# Patient Record
Sex: Female | Born: 1980 | Race: White | Hispanic: No | Marital: Single | Smoking: Never smoker
Health system: Southern US, Community
[De-identification: ages and names within clinical notes are randomized; demographics above are authoritative.]

## PROBLEM LIST (undated history)

## (undated) DIAGNOSIS — R7301 Impaired fasting glucose: Secondary | ICD-10-CM

## (undated) DIAGNOSIS — I4510 Unspecified right bundle-branch block: Secondary | ICD-10-CM

## (undated) DIAGNOSIS — E039 Hypothyroidism, unspecified: Secondary | ICD-10-CM

## (undated) DIAGNOSIS — I10 Essential (primary) hypertension: Secondary | ICD-10-CM

## (undated) DIAGNOSIS — B019 Varicella without complication: Secondary | ICD-10-CM

## (undated) DIAGNOSIS — E781 Pure hyperglyceridemia: Secondary | ICD-10-CM

## (undated) DIAGNOSIS — I499 Cardiac arrhythmia, unspecified: Secondary | ICD-10-CM

## (undated) DIAGNOSIS — R7303 Prediabetes: Secondary | ICD-10-CM

## (undated) DIAGNOSIS — E079 Disorder of thyroid, unspecified: Secondary | ICD-10-CM

## (undated) DIAGNOSIS — N390 Urinary tract infection, site not specified: Secondary | ICD-10-CM

## (undated) DIAGNOSIS — E7800 Pure hypercholesterolemia, unspecified: Secondary | ICD-10-CM

## (undated) DIAGNOSIS — J301 Allergic rhinitis due to pollen: Secondary | ICD-10-CM

## (undated) DIAGNOSIS — M2550 Pain in unspecified joint: Secondary | ICD-10-CM

## (undated) DIAGNOSIS — M1990 Unspecified osteoarthritis, unspecified site: Secondary | ICD-10-CM

## (undated) HISTORY — DX: Essential (primary) hypertension: I10

## (undated) HISTORY — DX: Allergic rhinitis due to pollen: J30.1

## (undated) HISTORY — DX: Pain in unspecified joint: M25.50

## (undated) HISTORY — DX: Hypothyroidism, unspecified: E03.9

## (undated) HISTORY — DX: Pure hyperglyceridemia: E78.1

## (undated) HISTORY — DX: Varicella without complication: B01.9

## (undated) HISTORY — PX: OTHER SURGICAL HISTORY: SHX169

## (undated) HISTORY — DX: Pure hypercholesterolemia, unspecified: E78.00

## (undated) HISTORY — DX: Impaired fasting glucose: R73.01

## (undated) HISTORY — DX: Prediabetes: R73.03

## (undated) HISTORY — DX: Cardiac arrhythmia, unspecified: I49.9

## (undated) HISTORY — DX: Unspecified right bundle-branch block: I45.10

## (undated) HISTORY — DX: Disorder of thyroid, unspecified: E07.9

## (undated) HISTORY — DX: Unspecified osteoarthritis, unspecified site: M19.90

## (undated) HISTORY — DX: Urinary tract infection, site not specified: N39.0

---

## 2015-06-28 DIAGNOSIS — F410 Panic disorder [episodic paroxysmal anxiety] without agoraphobia: Secondary | ICD-10-CM | POA: Diagnosis not present

## 2015-06-28 DIAGNOSIS — I10 Essential (primary) hypertension: Secondary | ICD-10-CM | POA: Diagnosis not present

## 2015-06-28 DIAGNOSIS — E039 Hypothyroidism, unspecified: Secondary | ICD-10-CM | POA: Diagnosis not present

## 2015-06-28 DIAGNOSIS — E669 Obesity, unspecified: Secondary | ICD-10-CM | POA: Diagnosis not present

## 2015-06-28 DIAGNOSIS — E782 Mixed hyperlipidemia: Secondary | ICD-10-CM | POA: Diagnosis not present

## 2015-06-28 DIAGNOSIS — Z79899 Other long term (current) drug therapy: Secondary | ICD-10-CM | POA: Diagnosis not present

## 2015-10-04 DIAGNOSIS — Z309 Encounter for contraceptive management, unspecified: Secondary | ICD-10-CM | POA: Diagnosis not present

## 2015-10-04 DIAGNOSIS — E039 Hypothyroidism, unspecified: Secondary | ICD-10-CM | POA: Diagnosis not present

## 2015-10-04 DIAGNOSIS — E782 Mixed hyperlipidemia: Secondary | ICD-10-CM | POA: Diagnosis not present

## 2015-10-04 DIAGNOSIS — Z79899 Other long term (current) drug therapy: Secondary | ICD-10-CM | POA: Diagnosis not present

## 2015-11-03 DIAGNOSIS — R0789 Other chest pain: Secondary | ICD-10-CM | POA: Diagnosis not present

## 2015-11-04 DIAGNOSIS — R0789 Other chest pain: Secondary | ICD-10-CM | POA: Diagnosis not present

## 2015-11-18 DIAGNOSIS — I10 Essential (primary) hypertension: Secondary | ICD-10-CM | POA: Diagnosis not present

## 2015-11-18 DIAGNOSIS — Z6841 Body Mass Index (BMI) 40.0 and over, adult: Secondary | ICD-10-CM | POA: Diagnosis not present

## 2015-11-18 DIAGNOSIS — J029 Acute pharyngitis, unspecified: Secondary | ICD-10-CM | POA: Diagnosis not present

## 2015-11-18 DIAGNOSIS — F410 Panic disorder [episodic paroxysmal anxiety] without agoraphobia: Secondary | ICD-10-CM | POA: Diagnosis not present

## 2015-12-13 DIAGNOSIS — F410 Panic disorder [episodic paroxysmal anxiety] without agoraphobia: Secondary | ICD-10-CM | POA: Diagnosis not present

## 2015-12-13 DIAGNOSIS — E669 Obesity, unspecified: Secondary | ICD-10-CM | POA: Diagnosis not present

## 2015-12-13 DIAGNOSIS — I10 Essential (primary) hypertension: Secondary | ICD-10-CM | POA: Diagnosis not present

## 2015-12-13 DIAGNOSIS — Z6839 Body mass index (BMI) 39.0-39.9, adult: Secondary | ICD-10-CM | POA: Diagnosis not present

## 2016-01-03 DIAGNOSIS — E782 Mixed hyperlipidemia: Secondary | ICD-10-CM | POA: Diagnosis not present

## 2016-01-03 DIAGNOSIS — J069 Acute upper respiratory infection, unspecified: Secondary | ICD-10-CM | POA: Diagnosis not present

## 2016-01-03 DIAGNOSIS — I10 Essential (primary) hypertension: Secondary | ICD-10-CM | POA: Diagnosis not present

## 2016-01-03 DIAGNOSIS — Z79899 Other long term (current) drug therapy: Secondary | ICD-10-CM | POA: Diagnosis not present

## 2016-01-03 DIAGNOSIS — E039 Hypothyroidism, unspecified: Secondary | ICD-10-CM | POA: Diagnosis not present

## 2016-04-11 DIAGNOSIS — E782 Mixed hyperlipidemia: Secondary | ICD-10-CM | POA: Diagnosis not present

## 2016-04-11 DIAGNOSIS — Z79899 Other long term (current) drug therapy: Secondary | ICD-10-CM | POA: Diagnosis not present

## 2016-04-11 DIAGNOSIS — E039 Hypothyroidism, unspecified: Secondary | ICD-10-CM | POA: Diagnosis not present

## 2016-04-11 DIAGNOSIS — I10 Essential (primary) hypertension: Secondary | ICD-10-CM | POA: Diagnosis not present

## 2016-04-11 DIAGNOSIS — Z1389 Encounter for screening for other disorder: Secondary | ICD-10-CM | POA: Diagnosis not present

## 2016-06-13 DIAGNOSIS — R42 Dizziness and giddiness: Secondary | ICD-10-CM | POA: Diagnosis not present

## 2016-06-13 DIAGNOSIS — H9190 Unspecified hearing loss, unspecified ear: Secondary | ICD-10-CM | POA: Diagnosis not present

## 2016-06-13 DIAGNOSIS — I10 Essential (primary) hypertension: Secondary | ICD-10-CM | POA: Diagnosis not present

## 2016-07-17 DIAGNOSIS — I10 Essential (primary) hypertension: Secondary | ICD-10-CM | POA: Diagnosis not present

## 2016-07-17 DIAGNOSIS — E039 Hypothyroidism, unspecified: Secondary | ICD-10-CM | POA: Diagnosis not present

## 2016-07-17 DIAGNOSIS — Z79899 Other long term (current) drug therapy: Secondary | ICD-10-CM | POA: Diagnosis not present

## 2016-07-17 DIAGNOSIS — F419 Anxiety disorder, unspecified: Secondary | ICD-10-CM | POA: Diagnosis not present

## 2016-07-17 DIAGNOSIS — E782 Mixed hyperlipidemia: Secondary | ICD-10-CM | POA: Diagnosis not present

## 2016-10-16 DIAGNOSIS — E782 Mixed hyperlipidemia: Secondary | ICD-10-CM | POA: Diagnosis not present

## 2016-10-16 DIAGNOSIS — E039 Hypothyroidism, unspecified: Secondary | ICD-10-CM | POA: Diagnosis not present

## 2016-10-16 DIAGNOSIS — Z79899 Other long term (current) drug therapy: Secondary | ICD-10-CM | POA: Diagnosis not present

## 2016-10-16 DIAGNOSIS — I10 Essential (primary) hypertension: Secondary | ICD-10-CM | POA: Diagnosis not present

## 2016-10-16 DIAGNOSIS — F419 Anxiety disorder, unspecified: Secondary | ICD-10-CM | POA: Diagnosis not present

## 2016-11-18 DIAGNOSIS — J189 Pneumonia, unspecified organism: Secondary | ICD-10-CM | POA: Diagnosis not present

## 2016-11-18 DIAGNOSIS — R0602 Shortness of breath: Secondary | ICD-10-CM | POA: Diagnosis not present

## 2016-12-18 DIAGNOSIS — I10 Essential (primary) hypertension: Secondary | ICD-10-CM | POA: Diagnosis not present

## 2016-12-18 DIAGNOSIS — F419 Anxiety disorder, unspecified: Secondary | ICD-10-CM | POA: Diagnosis not present

## 2016-12-18 DIAGNOSIS — R319 Hematuria, unspecified: Secondary | ICD-10-CM | POA: Diagnosis not present

## 2016-12-18 DIAGNOSIS — Z0000 Encounter for general adult medical examination without abnormal findings: Secondary | ICD-10-CM | POA: Diagnosis not present

## 2017-02-21 DIAGNOSIS — J069 Acute upper respiratory infection, unspecified: Secondary | ICD-10-CM | POA: Diagnosis not present

## 2017-02-21 DIAGNOSIS — I10 Essential (primary) hypertension: Secondary | ICD-10-CM | POA: Diagnosis not present

## 2017-02-21 DIAGNOSIS — Z6841 Body Mass Index (BMI) 40.0 and over, adult: Secondary | ICD-10-CM | POA: Diagnosis not present

## 2017-03-26 DIAGNOSIS — E782 Mixed hyperlipidemia: Secondary | ICD-10-CM | POA: Diagnosis not present

## 2017-03-26 DIAGNOSIS — E039 Hypothyroidism, unspecified: Secondary | ICD-10-CM | POA: Diagnosis not present

## 2017-03-26 DIAGNOSIS — I10 Essential (primary) hypertension: Secondary | ICD-10-CM | POA: Diagnosis not present

## 2017-03-26 DIAGNOSIS — F419 Anxiety disorder, unspecified: Secondary | ICD-10-CM | POA: Diagnosis not present

## 2017-03-26 DIAGNOSIS — Z79899 Other long term (current) drug therapy: Secondary | ICD-10-CM | POA: Diagnosis not present

## 2017-07-02 DIAGNOSIS — E039 Hypothyroidism, unspecified: Secondary | ICD-10-CM | POA: Diagnosis not present

## 2017-07-02 DIAGNOSIS — E782 Mixed hyperlipidemia: Secondary | ICD-10-CM | POA: Diagnosis not present

## 2017-07-02 DIAGNOSIS — R946 Abnormal results of thyroid function studies: Secondary | ICD-10-CM | POA: Diagnosis not present

## 2017-07-02 DIAGNOSIS — Z79899 Other long term (current) drug therapy: Secondary | ICD-10-CM | POA: Diagnosis not present

## 2017-10-08 DIAGNOSIS — R946 Abnormal results of thyroid function studies: Secondary | ICD-10-CM | POA: Diagnosis not present

## 2017-10-08 DIAGNOSIS — I10 Essential (primary) hypertension: Secondary | ICD-10-CM | POA: Diagnosis not present

## 2017-10-08 DIAGNOSIS — E039 Hypothyroidism, unspecified: Secondary | ICD-10-CM | POA: Diagnosis not present

## 2017-10-08 DIAGNOSIS — F419 Anxiety disorder, unspecified: Secondary | ICD-10-CM | POA: Diagnosis not present

## 2017-10-08 DIAGNOSIS — Z1331 Encounter for screening for depression: Secondary | ICD-10-CM | POA: Diagnosis not present

## 2017-10-08 DIAGNOSIS — Z79899 Other long term (current) drug therapy: Secondary | ICD-10-CM | POA: Diagnosis not present

## 2017-10-08 DIAGNOSIS — E782 Mixed hyperlipidemia: Secondary | ICD-10-CM | POA: Diagnosis not present

## 2018-04-22 DIAGNOSIS — F419 Anxiety disorder, unspecified: Secondary | ICD-10-CM | POA: Diagnosis not present

## 2018-04-22 DIAGNOSIS — Z79899 Other long term (current) drug therapy: Secondary | ICD-10-CM | POA: Diagnosis not present

## 2018-04-22 DIAGNOSIS — I10 Essential (primary) hypertension: Secondary | ICD-10-CM | POA: Diagnosis not present

## 2018-04-22 DIAGNOSIS — E782 Mixed hyperlipidemia: Secondary | ICD-10-CM | POA: Diagnosis not present

## 2018-04-22 DIAGNOSIS — E039 Hypothyroidism, unspecified: Secondary | ICD-10-CM | POA: Diagnosis not present

## 2018-11-04 DIAGNOSIS — Z79899 Other long term (current) drug therapy: Secondary | ICD-10-CM | POA: Diagnosis not present

## 2018-11-04 DIAGNOSIS — I10 Essential (primary) hypertension: Secondary | ICD-10-CM | POA: Diagnosis not present

## 2018-11-04 DIAGNOSIS — E039 Hypothyroidism, unspecified: Secondary | ICD-10-CM | POA: Diagnosis not present

## 2018-11-04 DIAGNOSIS — R350 Frequency of micturition: Secondary | ICD-10-CM | POA: Diagnosis not present

## 2018-11-04 DIAGNOSIS — F419 Anxiety disorder, unspecified: Secondary | ICD-10-CM | POA: Diagnosis not present

## 2018-11-04 DIAGNOSIS — E782 Mixed hyperlipidemia: Secondary | ICD-10-CM | POA: Diagnosis not present

## 2020-12-01 DIAGNOSIS — R011 Cardiac murmur, unspecified: Secondary | ICD-10-CM | POA: Diagnosis not present

## 2020-12-01 DIAGNOSIS — I517 Cardiomegaly: Secondary | ICD-10-CM | POA: Insufficient documentation

## 2020-12-01 HISTORY — PX: TRANSTHORACIC ECHOCARDIOGRAM: SHX275

## 2021-05-17 ENCOUNTER — Other Ambulatory Visit: Payer: Self-pay

## 2021-05-17 DIAGNOSIS — I10 Essential (primary) hypertension: Secondary | ICD-10-CM | POA: Insufficient documentation

## 2021-05-17 DIAGNOSIS — E785 Hyperlipidemia, unspecified: Secondary | ICD-10-CM | POA: Insufficient documentation

## 2021-05-19 ENCOUNTER — Encounter: Payer: Self-pay | Admitting: Family Medicine

## 2021-05-19 ENCOUNTER — Ambulatory Visit: Payer: BC Managed Care – PPO | Admitting: Family Medicine

## 2021-05-19 VITALS — BP 130/84 | HR 63 | Temp 98.3°F | Ht 65.0 in | Wt 210.0 lb

## 2021-05-19 DIAGNOSIS — Z23 Encounter for immunization: Secondary | ICD-10-CM

## 2021-05-19 DIAGNOSIS — E781 Pure hyperglyceridemia: Secondary | ICD-10-CM | POA: Diagnosis not present

## 2021-05-19 DIAGNOSIS — Z1231 Encounter for screening mammogram for malignant neoplasm of breast: Secondary | ICD-10-CM

## 2021-05-19 DIAGNOSIS — E039 Hypothyroidism, unspecified: Secondary | ICD-10-CM

## 2021-05-19 DIAGNOSIS — I10 Essential (primary) hypertension: Secondary | ICD-10-CM

## 2021-05-19 DIAGNOSIS — R7303 Prediabetes: Secondary | ICD-10-CM

## 2021-05-19 DIAGNOSIS — Z1159 Encounter for screening for other viral diseases: Secondary | ICD-10-CM

## 2021-05-19 DIAGNOSIS — Z0000 Encounter for general adult medical examination without abnormal findings: Secondary | ICD-10-CM

## 2021-05-19 LAB — CBC
HCT: 39.1 % (ref 36.0–46.0)
Hemoglobin: 12.9 g/dL (ref 12.0–15.0)
MCHC: 32.9 g/dL (ref 30.0–36.0)
MCV: 90.9 fl (ref 78.0–100.0)
Platelets: 391 10*3/uL (ref 150.0–400.0)
RBC: 4.3 Mil/uL (ref 3.87–5.11)
RDW: 14.2 % (ref 11.5–15.5)
WBC: 9.5 10*3/uL (ref 4.0–10.5)

## 2021-05-19 LAB — LIPID PANEL
Cholesterol: 164 mg/dL (ref 0–200)
HDL: 50.1 mg/dL (ref >39.00)
LDL Cholesterol: 99 mg/dL (ref 0–99)
NonHDL: 113.6
Total CHOL/HDL Ratio: 3
Triglycerides: 74 mg/dL (ref 0.0–149.0)
VLDL: 14.8 mg/dL (ref 0.0–40.0)

## 2021-05-19 LAB — COMPREHENSIVE METABOLIC PANEL
ALT: 18 U/L (ref 0–35)
AST: 17 U/L (ref 0–37)
Albumin: 4.8 g/dL (ref 3.5–5.2)
Alkaline Phosphatase: 45 U/L (ref 39–117)
BUN: 15 mg/dL (ref 6–23)
CO2: 27 mEq/L (ref 19–32)
Calcium: 9.9 mg/dL (ref 8.4–10.5)
Chloride: 103 mEq/L (ref 96–112)
Creatinine, Ser: 0.8 mg/dL (ref 0.40–1.20)
GFR: 91.13 mL/min (ref >60.00)
Glucose, Bld: 92 mg/dL (ref 70–99)
Potassium: 3.8 mEq/L (ref 3.5–5.1)
Sodium: 139 mEq/L (ref 135–145)
Total Bilirubin: 0.5 mg/dL (ref 0.2–1.2)
Total Protein: 7.4 g/dL (ref 6.0–8.3)

## 2021-05-19 LAB — HEMOGLOBIN A1C: Hgb A1c MFr Bld: 5.4 % (ref 4.6–6.5)

## 2021-05-19 LAB — TSH: TSH: 3.25 u[IU]/mL (ref 0.35–5.50)

## 2021-05-19 NOTE — Progress Notes (Signed)
Office Note ?05/19/2021 ? ?CC:  ?Chief Complaint  ?Patient presents with  ? Establish Care  ? Hypertension  ? ?HPI:  ?Nancy Murphy is a 41 y.o. female who is here to establish care, cpe, f/u chronic medical problems. ?Patient's most recent primary MD: [facility]. ?Old records were reviewed prior to or during today's visit. ? ?Nancy Murphy says she feels well. ? ?Most recent note and old records was from 03/17/2021 and was for follow-up hypertension, hypertriglyceridemia, hypothyroidism, and anxiety. ?Most recent CPE was 04/2020. ?T4 increased to from 88 to 100 qd about 2 mo ago b/c TSH slightly high at 5.2. ? ?She was having some hot flashe in the daytime as well as nighttime for couple of months earlier this year.  She has not had any for the last 1 to 2 months.  She did not have any other symptoms with her hot flashes. ? ?Brief history of some excessive anxiety when she was at a different job.  Use clonazepam very sparingly.  She does not feel like she needs this medication anymore. ?PMP AWARE reviewed today: most recent rx for clonazepam was filled 11/19/20, # 15, rx by prior pcp. ?No red flags. ? ? ?Past Medical History:  ?Diagnosis Date  ? Arthralgia   ? both wrists and left knee intermittently  ? Essential hypertension   ? Hay fever   ? Hypertriglyceridemia   ? Hypothyroidism   ? Incomplete RBBB   ? Prediabetes   ? a1c 5.8% 2022  ? ? ?Past Surgical History:  ?Procedure Laterality Date  ? nexplanon implant    ? 10/2020.  Patient states this was placed to induce amenorrhea  ? TRANSTHORACIC ECHOCARDIOGRAM  12/01/2020  ? EF 55-60%, borderline LVH, diast dysf  ? ? ?Family History  ?Problem Relation Age of Onset  ? Cancer Mother   ? Heart attack Mother   ? Diabetes Mother   ? Arthritis Father   ? Cancer Father   ? High blood pressure Father   ? ? ?Social History  ? ?Socioeconomic History  ? Marital status: Single  ?  Spouse name: Not on file  ? Number of children: Not on file  ? Years of  education: Not on file  ? Highest education level: Not on file  ?Occupational History  ? Not on file  ?Tobacco Use  ? Smoking status: Never  ?  Passive exposure: Never  ? Smokeless tobacco: Never  ?Substance and Sexual Activity  ? Alcohol use: Yes  ? Drug use: Never  ? Sexual activity: Yes  ?  Partners: Male  ?Other Topics Concern  ? Not on file  ?Social History Narrative  ? Single, no children.  ? Orig from [place].  Relocated to [facility] [place].  ? Lives in [place].  ? Educ: some college  ? OccupLawyer.  ? No T/A/Ds  ? ?Social Determinants of Health  ? ?Financial Resource Strain: Not on file  ?Food Insecurity: Not on file  ?Transportation Needs: Not on file  ?Physical Activity: Not on file  ?Stress: Not on file  ?Social Connections: Not on file  ?Intimate Partner Violence: Not on file  ? ? ?Outpatient Encounter Medications as of 05/19/2021  ?Medication Sig  ? ASPIRIN LOW DOSE PO Take 81 mg by mouth daily.  ? diltiazem (CARDIZEM CD) 300 MG 24 hr capsule Take 300 mg by mouth daily.  ? fenofibrate (TRICOR) 145 MG tablet   ? levothyroxine (SYNTHROID) 100 MCG tablet Take 100 mcg by mouth  every morning.  ? Multiple Vitamins-Minerals (ONE DAILY MULTIVITAMIN WOMEN PO) Take by mouth daily.  ? [DISCONTINUED] ELDERBERRY PO Take by mouth daily.  ? ?No facility-administered encounter medications on file as of 05/19/2021.  ? ? ?No Known Allergies ? ?Review of Systems  ?Constitutional:  Negative for chills and fever.  ?HENT:  Negative for congestion, ear pain and sore throat.   ?Eyes:  Negative for discharge and redness.  ?Respiratory:  Negative for cough, shortness of breath and wheezing.   ?Cardiovascular:  Negative for chest pain, palpitations and leg swelling.  ?Gastrointestinal:  Negative for abdominal pain, blood in stool, diarrhea, nausea and vomiting.  ?Genitourinary:  Negative for dysuria, flank pain, frequency, hematuria and urgency.  ?Musculoskeletal:  Negative for back pain and myalgias.  ?Skin:  Negative  for rash.  ?Neurological:  Negative for dizziness, weakness and headaches.  ?Endo/Heme/Allergies:  Does not bruise/bleed easily.  ?Psychiatric/Behavioral:  The patient is not nervous/anxious.   ? ?PE; ?Blood pressure 130/84, pulse 63, temperature 98.3 ?F (36.8 ?C), temperature source Oral, height 5\' 5"  (1.651 m), weight 210 lb (95.3 kg), SpO2 97 %.Body mass index is 34.95 kg/m?. ? ?Exam chaperoned by , CMA. ? ?Physical Exam ? ?Gen: Alert, well appearing.  Patient is oriented to person, place, time, and situation. ?AFFECT: pleasant, lucid thought and speech. ?ENT: Ears: EACs clear, normal epithelium.  TMs with good light reflex and landmarks bilaterally.  Eyes: no injection, icteris, swelling, or exudate.  EOMI, PERRLA. ?Nose: no drainage or turbinate edema/swelling.  No injection or focal lesion.  Mouth: lips without lesion/swelling.  Oral mucosa pink and moist.  Dentition intact and without obvious caries or gingival swelling.  Oropharynx without erythema, exudate, or swelling.  ?Neck: supple/nontender.  No LAD, mass, or TM.  Carotid pulses 2+ bilaterally, without bruits. ?CV: RRR, no m/r/g.   ?LUNGS: CTA bilat, nonlabored resps, good aeration in all lung fields. ?ABD: soft, NT, ND, BS normal.  No hepatospenomegaly or mass.  No bruits. ?EXT: no clubbing, cyanosis, or edema.  ?Musculoskeletal: no joint swelling, erythema, warmth, or tenderness.  ROM of all joints intact. ?Skin - no sores or suspicious lesions or rashes or color changes ? ?Pertinent labs:  ?None today  ? ?ASSESSMENT AND PLAN:  ? ?New patient, establishing care. ? ?#1 hypertension.  Well-controlled on diltiazem CD3 100 mg a day. ?Checking electrolytes and creatinine today. ? ?2.  Hypertriglyceridemia.  Stable on fenofibrate 145 mg a day. ?Lipid panel and hepatic panel today. ? ?3.  Hypothyroidism.  Mildly elevated TSH on monitoring 2 months ago.  Her dose was increased from 88 to 100 mcg a day at that time. ?Check TSH today. ? ?4.  Health maintenance exam: ?Reviewed age and gender appropriate health maintenance issues (prudent diet, regular exercise, health risks of tobacco and excessive alcohol, use of seatbelts, fire alarms in home, use of sunscreen).  Also reviewed age and gender appropriate health screening as well as vaccine recommendations. ?Vaccines: Tdap given today. ?Labs: fasting HP, Hba1c, hep c screening. ?Cervical ca screening: PAP/HPV neg 05/06/20.  No hx abnormals. Plan rpt 5 yrs. ?Breast ca screening: due for annual mammogram now (05/2020 mammo neg)--ordered. ?Colon ca screening: average risk patient= as per latest guidelines, start screening at 53 yrs of age. ? ?An After Visit Summary was printed and given to the patient. ? ?Return in about 6 months (around 11/19/2021) for routine chronic illness f/u. ? ?Signed:  13/04/2021, MD  05/19/2021 ? ?

## 2021-05-19 NOTE — Addendum Note (Signed)
Addended by: Emi Holes D on: 05/19/2021 10:24 AM ? ? Modules accepted: Orders ? ?

## 2021-05-19 NOTE — Patient Instructions (Signed)

## 2021-05-20 LAB — HEPATITIS C ANTIBODY
Hepatitis C Ab: NONREACTIVE
SIGNAL TO CUT-OFF: 0.09 (ref <1.00)

## 2021-05-21 ENCOUNTER — Encounter: Payer: Self-pay | Admitting: Family Medicine

## 2021-05-23 ENCOUNTER — Other Ambulatory Visit: Payer: Self-pay

## 2021-05-23 ENCOUNTER — Telehealth: Payer: Self-pay | Admitting: Family Medicine

## 2021-05-23 MED ORDER — FENOFIBRATE 145 MG PO TABS: 145.0000 mg | ORAL_TABLET | Freq: Every day | ORAL | 1 refills | Status: DC

## 2021-05-23 NOTE — Telephone Encounter (Signed)
Please advise 

## 2021-05-23 NOTE — Telephone Encounter (Signed)
Please sign incomplete notes  ?

## 2021-05-23 NOTE — Progress Notes (Unsigned)
She is no longer prediabetic since her Hba1c is < 5.7%. ?Also, triglycerides MUCH improved.  Now they are down to 74.   ?OK to stop fenofibrate now.  We'll recheck these labs at next f/u in 6 months. ?Keep up the great work! ?

## 2021-05-23 NOTE — Progress Notes (Unsigned)
Nancy Murphy, I want to reassure you that your complete blood count is completely normal.  Do not worry! ? ?

## 2021-05-23 NOTE — Telephone Encounter (Signed)
Pt returned call for lab results.  ?Best call back # [phone] ? ?Also said that pharmacy has not received RX for fenofibrate. ?

## 2021-05-23 NOTE — Telephone Encounter (Signed)
LM for pt to return call to discuss.  

## 2021-05-24 NOTE — Telephone Encounter (Signed)
No further action needed.

## 2021-06-21 ENCOUNTER — Ambulatory Visit
Admission: RE | Admit: 2021-06-21 | Discharge: 2021-06-21 | Disposition: A | Discharge status: Home / Self Care | Payer: BC Managed Care – PPO | Source: Ambulatory Visit | Attending: Family Medicine | Admitting: Family Medicine

## 2021-06-21 DIAGNOSIS — Z1231 Encounter for screening mammogram for malignant neoplasm of breast: Secondary | ICD-10-CM

## 2021-06-22 ENCOUNTER — Other Ambulatory Visit: Payer: Self-pay | Admitting: Family Medicine

## 2021-06-22 MED ORDER — LEVOTHYROXINE SODIUM 100 MCG PO TABS
100.0000 ug | ORAL_TABLET | Freq: Every morning | ORAL | 1 refills | Status: DC
Start: 2021-06-22 — End: 2021-11-22

## 2021-06-22 MED ORDER — DILTIAZEM HCL ER COATED BEADS 300 MG PO CP24
300.0000 mg | ORAL_CAPSULE | Freq: Every day | ORAL | 1 refills | Status: DC
Start: 2021-06-22 — End: 2022-01-20

## 2021-06-22 NOTE — Telephone Encounter (Signed)
Pt has not had meds previously filled by PCP. Last seen 06/08/21 for establish care appt.  Meds requested diltiazem and levothyroxine. Please fill, if appropriate.

## 2021-06-22 NOTE — Telephone Encounter (Signed)
Caller Name: Vanda Worsham  Call back phone [phone]  MEDICATION(S): diltiazem (CARDIZEM CD) 300 MG 24 hr capsule [id number] and levothyroxine (SYNTHROID) 100 MCG tablet [id number]    Days of Med Remaining:   Has the patient contacted their pharmacy (YES/NO)?  Yes she thinks they sent the request to her previous prov.  I  Preferred Pharmacy: [facility] Address: [address] [place], [place] [address]  ~~~Please advise patient/caregiver to allow 2-3 business days to process RX refills.

## 2021-06-23 ENCOUNTER — Other Ambulatory Visit: Payer: Self-pay | Admitting: Family Medicine

## 2021-06-23 DIAGNOSIS — R928 Other abnormal and inconclusive findings on diagnostic imaging of breast: Secondary | ICD-10-CM

## 2021-07-04 ENCOUNTER — Ambulatory Visit: Payer: BC Managed Care – PPO | Admitting: Family Medicine

## 2021-07-04 ENCOUNTER — Telehealth: Payer: Self-pay

## 2021-07-04 NOTE — Progress Notes (Deleted)
OFFICE VISIT  07/04/2021  CC: No chief complaint on file.   Patient is a 41 y.o. female who presents for ear concern.  HPI: ***    Past Medical History:  Diagnosis Date   Arthralgia    both wrists and left knee intermittently   Essential hypertension    Hay fever    Hypertriglyceridemia    Hypothyroidism    Incomplete RBBB    Prediabetes    a1c 5.8% 2022    Past Surgical History:  Procedure Laterality Date   nexplanon implant     10/2020.  Patient states this was placed to induce amenorrhea   TRANSTHORACIC ECHOCARDIOGRAM  12/01/2020   EF 55-60%, borderline LVH, diast dysf    Outpatient Medications Prior to Visit  Medication Sig Dispense Refill   ASPIRIN LOW DOSE PO Take 81 mg by mouth daily.     diltiazem (CARDIZEM CD) 300 MG 24 hr capsule Take 1 capsule (300 mg total) by mouth daily. 90 capsule 1   levothyroxine (SYNTHROID) 100 MCG tablet Take 1 tablet (100 mcg total) by mouth every morning. 90 tablet 1   Multiple Vitamins-Minerals (ONE DAILY MULTIVITAMIN WOMEN PO) Take by mouth daily.     No facility-administered medications prior to visit.    No Known Allergies  ROS As per HPI  PE:    05/19/2021    9:37 AM  Vitals with BMI  Height 5\' 5"   Weight 210 lbs  BMI 34.95  Systolic 130  Diastolic 84  Pulse 63     Physical Exam  ***  LABS:  Last CBC Lab Results  Component Value Date   WBC 9.5 05/19/2021   HGB 12.9 05/19/2021   HCT 39.1 05/19/2021   MCV 90.9 05/19/2021   RDW 14.2 05/19/2021   PLT 391.0 05/19/2021   Last metabolic panel Lab Results  Component Value Date   GLUCOSE 92 05/19/2021   NA 139 05/19/2021   K 3.8 05/19/2021   CL 103 05/19/2021   CO2 27 05/19/2021   BUN 15 05/19/2021   CREATININE 0.80 05/19/2021   CALCIUM 9.9 05/19/2021   PROT 7.4 05/19/2021   ALBUMIN 4.8 05/19/2021   BILITOT 0.5 05/19/2021   ALKPHOS 45 05/19/2021   AST 17 05/19/2021   ALT 18 05/19/2021   IMPRESSION AND PLAN:  No problem-specific Assessment  & Plan notes found for this encounter.   An After Visit Summary was printed and given to the patient.  FOLLOW UP: No follow-ups on file.  Signed:  07/19/2021, MD           07/04/2021

## 2021-07-04 NOTE — Telephone Encounter (Signed)
Pt cancelled via after hours line.  [facility] Day - Client Nonclinical Telephone Record  [facility] Client [facility] Day - Client Client Site [facility] - Day Provider Santiago Bumpers - MD Contact Type Call Who Is Calling Patient / Member / Family / Caregiver Caller Name Nancy Murphy Caller Phone Number [phone] Patient Name Nancy Murphy Patient DOB 12/09/1980 Call Type Message Only Information Provided Reason for Call Request to [facility] Appointment Initial Comment Caller states that she would like to cancel her appointment for Monday 07/04/2021. Disp. Time Disposition Final User 07/02/2021 6:53:54 PM General Information Provided Yes Cruz Condon, Amy Call Closed By: Val Riles Transaction Date/Time: 07/02/2021 6:51:33 PM (ET)

## 2021-07-12 ENCOUNTER — Other Ambulatory Visit: Payer: BC Managed Care – PPO

## 2021-07-29 ENCOUNTER — Ambulatory Visit: Payer: BC Managed Care – PPO

## 2021-07-29 ENCOUNTER — Ambulatory Visit
Admission: RE | Admit: 2021-07-29 | Discharge: 2021-07-29 | Disposition: A | Discharge status: Home / Self Care | Payer: BC Managed Care – PPO | Source: Ambulatory Visit | Attending: Family Medicine | Admitting: Family Medicine

## 2021-07-29 DIAGNOSIS — R928 Other abnormal and inconclusive findings on diagnostic imaging of breast: Secondary | ICD-10-CM

## 2021-09-26 ENCOUNTER — Ambulatory Visit: Payer: BC Managed Care – PPO | Admitting: Family Medicine

## 2021-09-26 ENCOUNTER — Encounter: Payer: Self-pay | Admitting: Family Medicine

## 2021-09-26 VITALS — BP 133/79 | HR 70 | Temp 98.5°F | Ht 65.0 in | Wt 224.6 lb

## 2021-09-26 DIAGNOSIS — R300 Dysuria: Secondary | ICD-10-CM | POA: Diagnosis not present

## 2021-09-26 DIAGNOSIS — R3129 Other microscopic hematuria: Secondary | ICD-10-CM

## 2021-09-26 DIAGNOSIS — L304 Erythema intertrigo: Secondary | ICD-10-CM | POA: Diagnosis not present

## 2021-09-26 LAB — POCT URINALYSIS DIPSTICK
Bilirubin, UA: NEGATIVE
Glucose, UA: NEGATIVE
Ketones, UA: NEGATIVE
Leukocytes, UA: NEGATIVE
Nitrite, UA: NEGATIVE
Protein, UA: POSITIVE — AB
Spec Grav, UA: 1.01 (ref 1.010–1.025)
Urobilinogen, UA: 0.2 E.U./dL
pH, UA: 6 (ref 5.0–8.0)

## 2021-09-26 LAB — URINALYSIS, ROUTINE W REFLEX MICROSCOPIC
Bilirubin Urine: NEGATIVE
Ketones, ur: NEGATIVE
Nitrite: NEGATIVE
Specific Gravity, Urine: 1.01 (ref 1.000–1.030)
Total Protein, Urine: NEGATIVE
Urine Glucose: NEGATIVE
Urobilinogen, UA: 0.2 (ref 0.0–1.0)
pH: 7 (ref 5.0–8.0)

## 2021-09-26 MED ORDER — KETOCONAZOLE 2 % EX CREA
1.0000 | TOPICAL_CREAM | Freq: Every day | CUTANEOUS | 1 refills | Status: DC
Start: 2021-09-26 — End: 2021-11-02

## 2021-09-26 NOTE — Patient Instructions (Signed)
You can use A&D ointment preventatively.

## 2021-09-26 NOTE — Progress Notes (Signed)
OFFICE VISIT  09/26/2021  CC:  Chief Complaint  Patient presents with   Infection of belly button    Pain, redness, odor, and leakage at times. Pt has noticed this within the past week. Was using neosporin and peroxide.    Patient is a 41 y.o. female who presents for "infection around belly button".  HPI: Nancy Murphy has had a red and irritated umbilical region for the last few months. Occasionally it has a fissure in it.  Occasionally some white substanc is found in it and it has bad odor. Has tried Neosporin some.  Used a little bit of peroxide some.  Additionally, she had urinary urgency, frequency, suprapubic pressure, and dysuria about 10 days ago.  She then saw a provider in urgent care and was prescribed Bactrim for 7 days.  She completed this.  Yesterday she was noting a bit of burning with urination but said she had just recently use a cleansing wipe that she had never used before. She has no urinary urgency or frequency or suprapubic pressure.  Past Medical History:  Diagnosis Date   Arthralgia    both wrists and left knee intermittently   Essential hypertension    Hay fever    Hypertriglyceridemia    Hypothyroidism    Incomplete RBBB    Prediabetes    a1c 5.8% 2022    Past Surgical History:  Procedure Laterality Date   nexplanon implant     10/2020.  Patient states this was placed to induce amenorrhea   TRANSTHORACIC ECHOCARDIOGRAM  12/01/2020   EF 55-60%, borderline LVH, diast dysf    Outpatient Medications Prior to Visit  Medication Sig Dispense Refill   ASPIRIN LOW DOSE PO Take 81 mg by mouth daily.     diltiazem (CARDIZEM CD) 300 MG 24 hr capsule Take 1 capsule (300 mg total) by mouth daily. 90 capsule 1   levothyroxine (SYNTHROID) 100 MCG tablet Take 1 tablet (100 mcg total) by mouth every morning. 90 tablet 1   Multiple Vitamins-Minerals (ONE DAILY MULTIVITAMIN WOMEN PO) Take by mouth daily.     No facility-administered medications prior to visit.    No  Known Allergies  ROS As per HPI  PE:    09/26/2021    2:00 PM 05/19/2021    9:37 AM  Vitals with BMI  Height 5\' 5"  5\' 5"   Weight 224 lbs 10 oz 210 lbs  BMI 37.38 34.95  Systolic 133 130  Diastolic 79 84  Pulse 70 63     Physical Exam  Gen: Alert, well appearing.  Patient is oriented to person, place, time, and situation. AFFECT: pleasant, lucid thought and speech. Umbilicus with erythematous macular rash with a bit of maceration.  No exudate.  LABS:  Last CBC Lab Results  Component Value Date   WBC 9.5 05/19/2021   HGB 12.9 05/19/2021   HCT 39.1 05/19/2021   MCV 90.9 05/19/2021   RDW 14.2 05/19/2021   PLT 391.0 05/19/2021   Last metabolic panel Lab Results  Component Value Date   GLUCOSE 92 05/19/2021   NA 139 05/19/2021   K 3.8 05/19/2021   CL 103 05/19/2021   CO2 27 05/19/2021   BUN 15 05/19/2021   CREATININE 0.80 05/19/2021   CALCIUM 9.9 05/19/2021   PROT 7.4 05/19/2021   ALBUMIN 4.8 05/19/2021   BILITOT 0.5 05/19/2021   ALKPHOS 45 05/19/2021   AST 17 05/19/2021   ALT 18 05/19/2021   Last hemoglobin A1c Lab Results  Component Value Date  HGBA1C 5.4 05/19/2021   IMPRESSION AND PLAN:  #1 intertrigo. Ketoconazole cream 2%, apply daily. A&E ointment recommended. Keep area as dry and cool as possible.  #2 dysuria.  I think this is due to irritation from a cleansing wipe. Her recent urinary tract infection symptoms have all resolved status post a 7-day course of Bactrim.  Point-of-care dipstick UA today with trace blood. Will send for micro and culture.  An After Visit Summary was printed and given to the patient.  FOLLOW UP: No follow-ups on file.  Signed:  Santiago Bumpers, MD           09/26/2021

## 2021-09-27 LAB — URINE CULTURE
MICRO NUMBER:: 13899791
Result:: NO GROWTH
SPECIMEN QUALITY:: ADEQUATE

## 2021-09-28 ENCOUNTER — Telehealth: Payer: Self-pay

## 2021-09-28 MED ORDER — FLUCONAZOLE 150 MG PO TABS: 150.0000 mg | ORAL_TABLET | Freq: Once | ORAL | 0 refills | Status: AC

## 2021-09-28 NOTE — Telephone Encounter (Signed)
-----   Message from Jeoffrey Massed, MD sent at 09/28/2021 12:02 PM EDT ----- Pls get the records from the urgent care that she saw recently---I particularly need their UA and urine culture results. In the meantime, I'm going to treat for the possibility of yeast infection as the cause of her symptoms. Pls send in diflucan 150mg , 1 tab po qd x 3, #3, no RF. Pt to call or send mychart message in 2d with report of how she's doing.

## 2021-11-02 ENCOUNTER — Encounter: Payer: Self-pay | Admitting: Family Medicine

## 2021-11-02 ENCOUNTER — Telehealth (INDEPENDENT_AMBULATORY_CARE_PROVIDER_SITE_OTHER): Payer: BC Managed Care – PPO | Admitting: Family Medicine

## 2021-11-02 VITALS — BP 152/95 | HR 65

## 2021-11-02 DIAGNOSIS — I10 Essential (primary) hypertension: Secondary | ICD-10-CM | POA: Diagnosis not present

## 2021-11-02 MED ORDER — VALSARTAN 80 MG PO TABS: 80.0000 mg | ORAL_TABLET | Freq: Every day | ORAL | 0 refills | Status: DC

## 2021-11-02 NOTE — Progress Notes (Signed)
Virtual Visit via Video Note  I connected with Nancy Murphy  on 11/02/21 at  2:40 PM EDT by a video enabled telemedicine application and verified that I am speaking with the correct person using two identifiers.  Location patient: [place] Location provider:work or home office Persons participating in the virtual visit: patient, provider  I discussed the limitations and requested verbal permission for telemedicine visit. The patient expressed understanding and agreed to proceed.   HPI: 41 y/o female being seen today for elevated blood pressure. She was feeling some headache and episodes of nausea 4-5 days ago so she started checking her blood pressure 2 days ago.  Measurements have been [phone] systolic over 91-478 diastolic. Heart rate typically in the 60s. States her blood pressure had been controlled in the past on Cardizem CD3 100 mg a day.  States she no longer has any headache or nausea.  ROS: No chest pain, no diaphoresis, no shortness of breath, no dizziness, no palpitations, no edema, no vision abnormalities.  Past Medical History:  Diagnosis Date   Arthralgia    both wrists and left knee intermittently   Essential hypertension    Hay fever    Hypertriglyceridemia    Hypothyroidism    Incomplete RBBB    Prediabetes    a1c 5.8% 2022    Past Surgical History:  Procedure Laterality Date   nexplanon implant     10/2020.  Patient states this was placed to induce amenorrhea   TRANSTHORACIC ECHOCARDIOGRAM  12/01/2020   EF 55-60%, borderline LVH, diast dysf     Current Outpatient Medications:    ASPIRIN LOW DOSE PO, Take 81 mg by mouth daily., Disp: , Rfl:    diltiazem (CARDIZEM CD) 300 MG 24 hr capsule, Take 1 capsule (300 mg total) by mouth daily., Disp: 90 capsule, Rfl: 1   levothyroxine (SYNTHROID) 100 MCG tablet, Take 1 tablet (100 mcg total) by mouth every morning., Disp: 90 tablet, Rfl: 1   Multiple Vitamins-Minerals (ONE DAILY MULTIVITAMIN WOMEN PO), Take by mouth daily.,  Disp: , Rfl:   EXAM:  VITALS per patient if applicable:     29/56/2130    2:31 PM 09/26/2021    2:00 PM 05/19/2021    9:37 AM  Vitals with BMI  Height  5\' 5"  5\' 5"   Weight  224 lbs 10 oz 210 lbs  BMI  86.57 84.69  Systolic [phone]  Diastolic 95 79 84  Pulse 65 70 63    GENERAL: alert, oriented, appears well and in no acute distress  HEENT: atraumatic, conjunttiva clear, no obvious abnormalities on inspection of external nose and ears  NECK: normal movements of the head and neck  LUNGS: on inspection no signs of respiratory distress, breathing rate appears normal, no obvious gross SOB, gasping or wheezing  CV: no obvious cyanosis  MS: moves all visible extremities without noticeable abnormality  PSYCH/NEURO: pleasant and cooperative, no obvious depression or anxiety, speech and thought processing grossly intact  LABS: none today    Chemistry      Component Value Date/Time   NA 139 05/19/2021 1016   K 3.8 05/19/2021 1016   CL 103 05/19/2021 1016   CO2 27 05/19/2021 1016   BUN 15 05/19/2021 1016   CREATININE 0.80 05/19/2021 1016      Component Value Date/Time   CALCIUM 9.9 05/19/2021 1016   ALKPHOS 45 05/19/2021 1016   AST 17 05/19/2021 1016   ALT 18 05/19/2021 1016   BILITOT 0.5 05/19/2021 1016  ASSESSMENT AND PLAN:  Discussed the following assessment and plan:  Uncontrolled hypertension. Start valsartan 80 mg a day.  Continue diltiazem CD3 100 mg a day. Continue to monitor blood pressure.  Reviewed goal blood pressure 130/80 or better. Continue Percocet eating low-sodium diet and exercising regularly. Recheck in the office 1 week to go over blood pressures and monitor electrolytes and kidney function.    I discussed the assessment and treatment plan with the patient. The patient was provided an opportunity to ask questions and all were answered. The patient agreed with the plan and demonstrated an understanding of the instructions.   F/u: 1  wk  Signed:  Santiago Bumpers, MD           11/02/2021

## 2021-11-03 ENCOUNTER — Other Ambulatory Visit: Payer: Self-pay

## 2021-11-15 ENCOUNTER — Encounter: Payer: Self-pay | Admitting: Family Medicine

## 2021-11-15 ENCOUNTER — Ambulatory Visit: Payer: BC Managed Care – PPO | Admitting: Family Medicine

## 2021-11-15 VITALS — BP 136/84 | HR 97 | Temp 98.7°F | Ht 65.0 in | Wt 220.8 lb

## 2021-11-15 DIAGNOSIS — I10 Essential (primary) hypertension: Secondary | ICD-10-CM

## 2021-11-15 LAB — BASIC METABOLIC PANEL
BUN: 14 mg/dL (ref 6–23)
CO2: 29 mEq/L (ref 19–32)
Calcium: 9.4 mg/dL (ref 8.4–10.5)
Chloride: 102 mEq/L (ref 96–112)
Creatinine, Ser: 0.67 mg/dL (ref 0.40–1.20)
GFR: 107.73 mL/min (ref >60.00)
Glucose, Bld: 101 mg/dL — ABNORMAL HIGH (ref 70–99)
Potassium: 3.8 mEq/L (ref 3.5–5.1)
Sodium: 140 mEq/L (ref 135–145)

## 2021-11-15 MED ORDER — VALSARTAN 80 MG PO TABS: 80.0000 mg | ORAL_TABLET | Freq: Every day | ORAL | 1 refills | Status: DC

## 2021-11-15 MED ORDER — AMOXICILLIN 875 MG PO TABS: 875.0000 mg | ORAL_TABLET | Freq: Two times a day (BID) | ORAL | 0 refills | Status: AC

## 2021-11-15 NOTE — Progress Notes (Signed)
OFFICE VISIT  11/15/2021  CC:  Chief Complaint  Patient presents with   URI    Congestion., sore throat, runny nose, cough x4 days. Alkaselzer cold plus, Zicam with little relief     Patient is a 41 y.o. female who presents for cold symptoms as well as 2-week follow-up uncontrolled hypertension. A/P as of last visit: "Uncontrolled hypertension. Start valsartan 80 mg a day.  Continue diltiazem CD3 100 mg a day. Continue to monitor blood pressure.  Reviewed goal blood pressure 130/80 or better. Continue efforts at eating low-sodium diet and exercising regularly. Recheck in the office 1 week to go over blood pressures and monitor electrolytes and kidney function"  HPI: 4d nasal cong/runny nose, PND, mild ST (better now), some cough, some fatigue and mild HA. No body aches, no SOB, no fevers, no wheezing. Alka seltzer cold, zycam, no help.  Home bp's avg 287 systolic since getting on valsartan 2 wks ago.   Past Medical History:  Diagnosis Date   Arthralgia    both wrists and left knee intermittently   Essential hypertension    Hay fever    Hypertriglyceridemia    Hypothyroidism    Incomplete RBBB    Prediabetes    a1c 5.8% 2022    Past Surgical History:  Procedure Laterality Date   nexplanon implant     10/2020.  Patient states this was placed to induce amenorrhea   TRANSTHORACIC ECHOCARDIOGRAM  12/01/2020   EF 55-60%, borderline LVH, diast dysf    Outpatient Medications Prior to Visit  Medication Sig Dispense Refill   ASPIRIN LOW DOSE PO Take 81 mg by mouth daily.     diltiazem (CARDIZEM CD) 300 MG 24 hr capsule Take 1 capsule (300 mg total) by mouth daily. 90 capsule 1   levothyroxine (SYNTHROID) 100 MCG tablet Take 1 tablet (100 mcg total) by mouth every morning. 90 tablet 1   Multiple Vitamins-Minerals (ONE DAILY MULTIVITAMIN WOMEN PO) Take by mouth daily.     valsartan (DIOVAN) 80 MG tablet Take 1 tablet (80 mg total) by mouth daily. 30 tablet 0   No  facility-administered medications prior to visit.    No Known Allergies  ROS As per HPI  PE:    11/15/2021   10:23 AM 11/02/2021    2:31 PM 09/26/2021    2:00 PM  Vitals with BMI  Height 5\' 5"   5\' 5"   Weight 220 lbs 13 oz  224 lbs 10 oz  BMI 86.76  72.09  Systolic [phone]  Diastolic 84 95 79  Pulse 97 65 70     Physical Exam  VS: noted--normal. Gen: alert, NAD, NONTOXIC APPEARING. HEENT: eyes without injection, drainage, or swelling.  Ears: EACs clear, TMs with normal light reflex and landmarks.  Nose: Clear rhinorrhea, with some dried, crusty exudate adherent to mildly injected mucosa.  No purulent d/c.  No paranasal sinus TTP.  No facial swelling.  Throat and mouth without focal lesion.  No pharyngial swelling, erythema, or exudate.   Neck: supple, no LAD.   LUNGS: CTA bilat, nonlabored resps.   CV: RRR, no m/r/g. EXT: no c/c/e SKIN: no rash   LABS:  Last CBC Lab Results  Component Value Date   WBC 9.5 05/19/2021   HGB 12.9 05/19/2021   HCT 39.1 05/19/2021   MCV 90.9 05/19/2021   RDW 14.2 05/19/2021   PLT 391.0 62/94/7654   Last metabolic panel Lab Results  Component Value Date   GLUCOSE 92 05/19/2021  NA 139 05/19/2021   K 3.8 05/19/2021   CL 103 05/19/2021   CO2 27 05/19/2021   BUN 15 05/19/2021   CREATININE 0.80 05/19/2021   CALCIUM 9.9 05/19/2021   PROT 7.4 05/19/2021   ALBUMIN 4.8 05/19/2021   BILITOT 0.5 05/19/2021   ALKPHOS 45 05/19/2021   AST 17 05/19/2021   ALT 18 05/19/2021   Lab Results  Component Value Date   TSH 3.25 05/19/2021   IMPRESSION AND PLAN:  1) Viral URI. Discussed otc symptom mgmt. Rapid flu, covid, and strep all neg here today. Amoxicillin 875 twice daily x7 days--> scription sent and patient is to start this only if not improving in 2 days. Signs/symptoms to call or return for were reviewed and pt expressed understanding.  2) HTN, now well controlled since recent addition of valsartan 80 mg qd. BMET  today.  An After Visit Summary was printed and given to the patient.  FOLLOW UP: Return in about 6 months (around 05/16/2022) for annual CPE (fasting).  Signed:  Crissie Sickles, MD           11/15/2021

## 2021-11-22 ENCOUNTER — Ambulatory Visit: Payer: BC Managed Care – PPO | Admitting: Family Medicine

## 2021-11-22 ENCOUNTER — Other Ambulatory Visit: Payer: Self-pay | Admitting: Family Medicine

## 2021-11-22 ENCOUNTER — Telehealth: Payer: Self-pay | Admitting: Family Medicine

## 2021-11-22 MED ORDER — BENZONATATE 100 MG PO CAPS: 100.0000 mg | ORAL_CAPSULE | Freq: Two times a day (BID) | ORAL | 0 refills | Status: DC | PRN

## 2021-11-22 MED ORDER — BENZONATATE 200 MG PO CAPS: 200.0000 mg | ORAL_CAPSULE | Freq: Two times a day (BID) | ORAL | 0 refills | Status: DC | PRN

## 2021-11-22 NOTE — Telephone Encounter (Signed)
Pt was recently here for a cold on 11/15/21, however she states her cough has worsened and would like to know if another medication can be called in to help with the worsening cough. Please advise pt.

## 2021-11-22 NOTE — Telephone Encounter (Signed)
Pt is also stating that she does not have any refills on her Levothyroxine and is almost out of the medication. I did not notice an office was necessary at this time.

## 2021-11-22 NOTE — Telephone Encounter (Signed)
Please advise if alternative can be provided.

## 2021-11-22 NOTE — Telephone Encounter (Signed)
Please send a prescription for Tessalon Perles 200 mg, 1 twice daily as needed, #20, no refill.

## 2021-11-22 NOTE — Telephone Encounter (Signed)
Confirmed with the pharmacy, pt does not have any refills. 6 month supply sent to pharmacy. Pt advised medication sent.

## 2021-11-22 NOTE — Telephone Encounter (Signed)
Pt advised medication sent. 

## 2022-01-20 ENCOUNTER — Other Ambulatory Visit: Payer: Self-pay

## 2022-01-20 MED ORDER — DILTIAZEM HCL ER COATED BEADS 300 MG PO CP24: 300.0000 mg | ORAL_CAPSULE | Freq: Every day | ORAL | 1 refills | Status: DC

## 2022-01-27 ENCOUNTER — Ambulatory Visit (INDEPENDENT_AMBULATORY_CARE_PROVIDER_SITE_OTHER): Payer: BC Managed Care – PPO | Admitting: Family Medicine

## 2022-01-27 ENCOUNTER — Encounter: Payer: Self-pay | Admitting: Family Medicine

## 2022-01-27 VITALS — BP 137/82 | HR 85 | Temp 98.5°F | Ht 65.0 in | Wt 227.4 lb

## 2022-01-27 DIAGNOSIS — B3731 Acute candidiasis of vulva and vagina: Secondary | ICD-10-CM | POA: Diagnosis not present

## 2022-01-27 DIAGNOSIS — R051 Acute cough: Secondary | ICD-10-CM | POA: Diagnosis not present

## 2022-01-27 DIAGNOSIS — J069 Acute upper respiratory infection, unspecified: Secondary | ICD-10-CM

## 2022-01-27 LAB — POC COVID19 BINAXNOW: SARS Coronavirus 2 Ag: NEGATIVE

## 2022-01-27 LAB — POCT RAPID STREP A (OFFICE): Rapid Strep A Screen: NEGATIVE

## 2022-01-27 MED ORDER — DOXYCYCLINE HYCLATE 100 MG PO CAPS: 100.0000 mg | ORAL_CAPSULE | Freq: Two times a day (BID) | ORAL | 0 refills | Status: AC

## 2022-01-27 MED ORDER — FLUCONAZOLE 150 MG PO TABS: ORAL_TABLET | ORAL | 0 refills | Status: DC

## 2022-01-27 MED ORDER — HYDROCODONE BIT-HOMATROP MBR 5-1.5 MG/5ML PO SOLN: ORAL | 0 refills | Status: DC

## 2022-01-27 NOTE — Progress Notes (Signed)
OFFICE VISIT  01/27/2022  CC:  Chief Complaint  Patient presents with   Cough   Sore Throat    Patient is a 42 y.o. female who presents for nasal congestion and sore throat.  HPI: Onset 8 days ago: Nasal congestion, postnasal drip, sore throat, cough.  Subjective fever x 1 day but none since.  No wheezing, shortness of breath, or chest pain.  No face pain or upper teeth pain. Cough is worse at night, keeps her up. Symptoms have not improved at all. Trying NyQuil and DayQuil. 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 Home COVID test was -4 days ago.   Past Medical History:  Diagnosis Date   Arthralgia    both wrists and left knee intermittently   Essential hypertension    Hay fever    Hypertriglyceridemia    Hypothyroidism    Incomplete RBBB    Prediabetes    a1c 5.8% 2022    Past Surgical History:  Procedure Laterality Date   nexplanon implant     10/2020.  Patient states this was placed to induce amenorrhea   TRANSTHORACIC ECHOCARDIOGRAM  12/01/2020   EF 55-60%, borderline LVH, diast dysf    Outpatient Medications Prior to Visit  Medication Sig Dispense Refill   ASPIRIN LOW DOSE PO Take 81 mg by mouth daily.     diltiazem (CARDIZEM CD) 300 MG 24 hr capsule Take 1 capsule (300 mg total) by mouth daily. 90 capsule 1   levothyroxine (SYNTHROID) 100 MCG tablet TAKE 1 TABLET(100 MCG) BY MOUTH EVERY MORNING 90 tablet 1   Multiple Vitamins-Minerals (ONE DAILY MULTIVITAMIN WOMEN PO) Take by mouth daily.     valsartan (DIOVAN) 80 MG tablet Take 1 tablet (80 mg total) by mouth daily. 90 tablet 1   benzonatate (TESSALON) 200 MG capsule Take 1 capsule (200 mg total) by mouth 2 (two) times daily as needed for cough. (Patient not taking: Reported on 01/27/2022) 20 capsule 0   No facility-administered medications prior to visit.    No Known Allergies  Review of Systems  As per HPI  PE:    01/27/2022    9:37 AM 11/15/2021   10:23 AM 11/02/2021     2:31 PM  Vitals with BMI  Height 5\' 5"  5\' 5"    Weight 227 lbs 6 oz 220 lbs 13 oz   BMI 16.10 96.04   Systolic 540 981 191  Diastolic 82 84 95  Pulse 85 97 65   Physical Exam  VS: noted--normal. Gen: alert, NAD, NONTOXIC APPEARING. HEENT: eyes without injection, drainage, or swelling.  Ears: EACs clear, TMs with normal light reflex and landmarks.  Nose: Clear rhinorrhea, with some dried, crusty exudate adherent to mildly injected mucosa.  No purulent d/c.  No paranasal sinus TTP.  No facial swelling.  Throat and mouth without focal lesion.  No pharyngial swelling, erythema, or exudate.   Neck: supple, no LAD.   LUNGS: CTA bilat, nonlabored resps.   CV: RRR, no m/r/g. EXT: no c/c/e SKIN: no rash   LABS:  Last metabolic panel Lab Results  Component Value Date   GLUCOSE 101 (H) 11/15/2021   NA 140 11/15/2021   K 3.8 11/15/2021   CL 102 11/15/2021   CO2 29 11/15/2021   BUN 14 11/15/2021   CREATININE 0.67 11/15/2021   CALCIUM 9.4 11/15/2021   PROT 7.4 05/19/2021   ALBUMIN 4.8 05/19/2021   BILITOT 0.5 05/19/2021   ALKPHOS 45 05/19/2021  AST 17 05/19/2021   ALT 18 05/19/2021   IMPRESSION AND PLAN:  #1 prolonged URI with cough. Doxycycline 100 mg twice daily x 7 days. Hycodan syrup, 1 to 2 teaspoons twice daily as needed, 120 mL.  Covid today: NEG Strep today: NEG RSV sent to lab.  #2 yeast vaginitis. Patient describes vaginal burning, slight discharge, and itching. Diflucan 150 mg x 1 dose prescribed today.  An After Visit Summary was printed and given to the patient.  FOLLOW UP: No follow-ups on file.  Signed:  Crissie Sickles, MD           01/27/2022

## 2022-01-27 NOTE — Patient Instructions (Addendum)
Use over the counter saline nasal spray 3-4 times a day for nasal congestion and to moisturize nose.

## 2022-05-16 ENCOUNTER — Encounter: Payer: BC Managed Care – PPO | Admitting: Family Medicine

## 2022-05-26 ENCOUNTER — Other Ambulatory Visit: Payer: Self-pay

## 2022-05-26 MED ORDER — LEVOTHYROXINE SODIUM 100 MCG PO TABS: ORAL_TABLET | ORAL | 1 refills | Status: DC

## 2022-05-26 MED ORDER — VALSARTAN 80 MG PO TABS: 80.0000 mg | ORAL_TABLET | Freq: Every day | ORAL | 1 refills | Status: DC

## 2022-05-30 ENCOUNTER — Encounter: Payer: BC Managed Care – PPO | Admitting: Family Medicine

## 2022-06-15 ENCOUNTER — Encounter: Payer: BC Managed Care – PPO | Admitting: Family Medicine

## 2022-06-21 ENCOUNTER — Encounter: Payer: BC Managed Care – PPO | Admitting: Family Medicine

## 2022-06-30 ENCOUNTER — Encounter: Payer: BC Managed Care – PPO | Admitting: Family Medicine

## 2022-07-12 NOTE — Patient Instructions (Signed)

## 2022-07-13 ENCOUNTER — Ambulatory Visit (INDEPENDENT_AMBULATORY_CARE_PROVIDER_SITE_OTHER): Payer: BC Managed Care – PPO | Admitting: Family Medicine

## 2022-07-13 ENCOUNTER — Encounter: Payer: Self-pay | Admitting: Family Medicine

## 2022-07-13 VITALS — BP 138/85 | HR 70 | Temp 98.9°F | Ht 66.0 in | Wt 223.6 lb

## 2022-07-13 DIAGNOSIS — R42 Dizziness and giddiness: Secondary | ICD-10-CM

## 2022-07-13 DIAGNOSIS — E039 Hypothyroidism, unspecified: Secondary | ICD-10-CM | POA: Diagnosis not present

## 2022-07-13 DIAGNOSIS — Z0000 Encounter for general adult medical examination without abnormal findings: Secondary | ICD-10-CM

## 2022-07-13 DIAGNOSIS — Z1231 Encounter for screening mammogram for malignant neoplasm of breast: Secondary | ICD-10-CM

## 2022-07-13 DIAGNOSIS — I10 Essential (primary) hypertension: Secondary | ICD-10-CM | POA: Diagnosis not present

## 2022-07-13 MED ORDER — VALSARTAN 80 MG PO TABS: 80.0000 mg | ORAL_TABLET | Freq: Every day | ORAL | 1 refills | Status: DC

## 2022-07-13 MED ORDER — LEVOTHYROXINE SODIUM 100 MCG PO TABS: ORAL_TABLET | ORAL | 1 refills | Status: DC

## 2022-07-13 NOTE — Progress Notes (Signed)
Office Note 07/13/2022  CC:  Chief Complaint  Patient presents with   Annual Exam    Fasting CPE.    HPI:  Patient is a 42 y.o. female who is here for annual health maintenance exam and 13-month follow-up hypertension and hypothyroidism.  Home blood pressures consistently 120s over 70s. She takes her levothyroxine every morning on an empty stomach.  She has at least a 7-year history of recurrent episodes of lightheadedness/presyncope with some sweating and palpitations that follow.  These episodes consistently occur only a day or day after she does not use swimmer's ear in her ears.  As long as she uses swimmer ear drops in her ears after every shower she states she does not have these episodes. No postural trigger, no trigger with head turning.  Denies actual vertigo.  No nausea, no chest pain, no shortness of breath. She has seen an ENT and a cardiologist in the past and they have both reassured her.   Past Medical History:  Diagnosis Date   Arthralgia    both wrists and left knee intermittently   Essential hypertension    Hay fever    Hypertriglyceridemia    Hypothyroidism    Incomplete RBBB    Prediabetes    a1c 5.8% 2022    Past Surgical History:  Procedure Laterality Date   nexplanon implant     10/2020.  Patient states this was placed to induce amenorrhea   TRANSTHORACIC ECHOCARDIOGRAM  12/01/2020   EF 55-60%, borderline LVH, diast dysf    Family History  Problem Relation Age of Onset   Cancer Mother    Heart attack Mother    Diabetes Mother    Arthritis Father    Cancer Father    High blood pressure Father     Social History   Socioeconomic History   Marital status: Single    Spouse name: Not on file   Number of children: Not on file   Years of education: Not on file   Highest education level: Not on file  Occupational History   Not on file  Tobacco Use   Smoking status: Never    Passive exposure: Never   Smokeless tobacco: Never  Substance  and Sexual Activity   Alcohol use: Yes   Drug use: Never   Sexual activity: Yes    Partners: Male  Other Topics Concern   Not on file  Social History Narrative   Single, no children.   [place]ig from [place].  Relocated to [facility] [place].   Lives in [place].   Educ: some college   OccupLawyer.   No T/A/Ds   Social Determinants of Health   Financial Resource Strain: Patient Declined (11/14/2021)   Overall Financial Resource Strain (CARDIA)    Difficulty of Paying Living Expenses: Patient declined  Food Insecurity: Unknown (11/14/2021)   Hunger Vital Sign    Worried About Running Out of Food in the Last Year: Patient declined    Ran Out of Food in the Last Year: Not on file  Transportation Needs: Patient Declined (11/14/2021)   PRAPARE - Administrator, Civil Service (Medical): Patient declined    Lack of Transportation (Non-Medical): Patient declined  Physical Activity: Insufficiently Active (11/14/2021)   Exercise Vital Sign    Days of Exercise per Week: 2 days    Minutes of Exercise per Session: 40 min  Stress: No Stress Concern Present (11/14/2021)   [employer] of Occupational Health - Occupational Stress Questionnaire  Feeling of Stress : Not at all  Social Connections: Unknown (11/14/2021)   Social Connection and Isolation Panel [NHANES]    Frequency of Communication with Friends and Family: Twice a week    Frequency of Social Gatherings with Friends and Family: Patient declined    Attends Religious Services: Patient declined    Database administrator or Organizations: No    Attends Engineer, structural: Not on file    Marital Status: Patient declined  Catering manager Violence: Not on file    Outpatient Medications Prior to Visit  Medication Sig Dispense Refill   diltiazem (CARDIZEM CD) 300 MG 24 hr capsule Take 1 capsule (300 mg total) by mouth daily. 90 capsule 1   Multiple Vitamins-Minerals (ONE DAILY MULTIVITAMIN WOMEN  PO) Take by mouth daily.     levothyroxine (SYNTHROID) 100 MCG tablet TAKE 1 TABLET(100 MCG) BY MOUTH EVERY MORNING 90 tablet 1   valsartan (DIOVAN) 80 MG tablet Take 1 tablet (80 mg total) by mouth daily. 90 tablet 1   ASPIRIN LOW DOSE PO Take 81 mg by mouth daily. (Patient not taking: Reported on 07/13/2022)     fluconazole (DIFLUCAN) 150 MG tablet 1 tab po qd x 1 dose (Patient not taking: Reported on 07/13/2022) 1 tablet 0   HYDROcodone bit-homatropine (HYCODAN) 5-1.5 MG/5ML syrup 1-2 tsp po bid prn cough 120 mL 0   No facility-administered medications prior to visit.    No Known Allergies  Review of Systems  Constitutional:  Negative for appetite change, chills, fatigue and fever.  HENT:  Negative for congestion, dental problem, ear pain and sore throat.   Eyes:  Negative for discharge, redness and visual disturbance.  Respiratory:  Negative for cough, chest tightness, shortness of breath and wheezing.   Cardiovascular:  Negative for chest pain, palpitations and leg swelling.  Gastrointestinal:  Negative for abdominal pain, blood in stool, diarrhea, nausea and vomiting.  Genitourinary:  Negative for difficulty urinating, dysuria, flank pain, frequency, hematuria and urgency.  Musculoskeletal:  Negative for arthralgias, back pain, joint swelling, myalgias and neck stiffness.  Skin:  Negative for pallor and rash.  Neurological:  Negative for dizziness, speech difficulty, weakness and headaches.  Hematological:  Negative for adenopathy. Does not bruise/bleed easily.  Psychiatric/Behavioral:  Negative for confusion and sleep disturbance. The patient is not nervous/anxious.     PE;    07/13/2022    1:08 PM 01/27/2022    9:37 AM 11/15/2021   10:23 AM  Vitals with BMI  Height 5\' 6"  5\' 5"  5\' 5"   Weight 223 lbs 10 oz 227 lbs 6 oz 220 lbs 13 oz  BMI 36.11 37.84 36.74  Systolic 138 137 914  Diastolic 85 82 84  Pulse 70 85 97   Gen: Alert, well appearing.  Patient is oriented to person,  place, time, and situation. AFFECT: pleasant, lucid thought and speech. ENT: Ears: EACs clear, normal epithelium.  TMs with good light reflex and landmarks bilaterally.  Eyes: no injection, icteris, swelling, or exudate.  EOMI, PERRLA. Nose: no drainage or turbinate edema/swelling.  No injection or focal lesion.  Mouth: lips without lesion/swelling.  Oral mucosa pink and moist.  Dentition intact and without obvious caries or gingival swelling.  Oropharynx without erythema, exudate, or swelling.  Neck: supple/nontender.  No LAD, mass, or TM.  Carotid pulses 2+ bilaterally, without bruits. CV: RRR, no m/r/g.   LUNGS: CTA bilat, nonlabored resps, good aeration in all lung fields. ABD: soft, NT, ND, BS normal.  No hepatospenomegaly or mass.  No bruits. EXT: no clubbing, cyanosis, or edema.  Musculoskeletal: no joint swelling, erythema, warmth, or tenderness.  ROM of all joints intact. Skin - no sores or suspicious lesions or rashes or color changes  Pertinent labs:  Lab Results  Component Value Date   TSH 3.25 05/19/2021   Lab Results  Component Value Date   WBC 9.5 05/19/2021   HGB 12.9 05/19/2021   HCT 39.1 05/19/2021   MCV 90.9 05/19/2021   PLT 391.0 05/19/2021   Lab Results  Component Value Date   CREATININE 0.67 11/15/2021   BUN 14 11/15/2021   NA 140 11/15/2021   K 3.8 11/15/2021   CL 102 11/15/2021   CO2 29 11/15/2021   Lab Results  Component Value Date   ALT 18 05/19/2021   AST 17 05/19/2021   ALKPHOS 45 05/19/2021   BILITOT 0.5 05/19/2021   Lab Results  Component Value Date   CHOL 164 05/19/2021   Lab Results  Component Value Date   HDL 50.10 05/19/2021   Lab Results  Component Value Date   LDLCALC 99 05/19/2021   Lab Results  Component Value Date   TRIG 74.0 05/19/2021   Lab Results  Component Value Date   CHOLHDL 3 05/19/2021   Lab Results  Component Value Date   HGBA1C 5.4 05/19/2021   ASSESSMENT AND PLAN:   #1 health maintenance  exam: Reviewed age and gender appropriate health maintenance issues (prudent diet, regular exercise, health risks of tobacco and excessive alcohol, use of seatbelts, fire alarms in home, use of sunscreen).  Also reviewed age and gender appropriate health screening as well as vaccine recommendations. Vaccines: All up-to-date Labs: fasting HP. Cervical ca screening: PAP/HPV neg 05/06/20.  No hx abnormals. Plan rpt 5 yrs. Breast ca screening: due for annual mammogram now --ordered Colon ca screening: average risk patient= as per latest guidelines, start screening at 72 yrs of age.  #2 hypertension, well-controlled on valsartan 80 mg a day and diltiazem CD 300 mg a day. Electrolytes and creatinine today.  3.  Acquired hypothyroidism.  She has done well on levothyroxine 100 mcg every morning. TSH monitoring today.  3.  Episodic dizziness/presyncope. Unknown etiology. Reassuring cardiology and ENT evaluations in the past. For now, she will continue to simply use swimmer's ear drops after every shower since she states she never has the episodes when she does this. Observe for any new or prolonged symptoms and call if these occur.  An After Visit Summary was printed and given to the patient.  FOLLOW UP:  Return in about 6 months (around 01/12/2023) for routine chronic illness f/u.  Signed:  Santiago Bumpers, MD           07/13/2022

## 2022-07-18 ENCOUNTER — Other Ambulatory Visit (INDEPENDENT_AMBULATORY_CARE_PROVIDER_SITE_OTHER): Payer: BC Managed Care – PPO

## 2022-07-18 DIAGNOSIS — I10 Essential (primary) hypertension: Secondary | ICD-10-CM | POA: Diagnosis not present

## 2022-07-18 DIAGNOSIS — E039 Hypothyroidism, unspecified: Secondary | ICD-10-CM

## 2022-07-18 LAB — CBC WITH DIFFERENTIAL/PLATELET
Basophils Absolute: 0.1 10*3/uL (ref 0.0–0.1)
Basophils Relative: 0.7 % (ref 0.0–3.0)
Eosinophils Absolute: 0.3 10*3/uL (ref 0.0–0.7)
Eosinophils Relative: 2.9 % (ref 0.0–5.0)
HCT: 37.2 % (ref 36.0–46.0)
Hemoglobin: 12.3 g/dL (ref 12.0–15.0)
Lymphocytes Relative: 30.9 % (ref 12.0–46.0)
Lymphs Abs: 3 10*3/uL (ref 0.7–4.0)
MCHC: 33 g/dL (ref 30.0–36.0)
MCV: 91.7 fl (ref 78.0–100.0)
Monocytes Absolute: 0.7 10*3/uL (ref 0.1–1.0)
Monocytes Relative: 7.6 % (ref 3.0–12.0)
Neutro Abs: 5.6 10*3/uL (ref 1.4–7.7)
Neutrophils Relative %: 57.9 % (ref 43.0–77.0)
Platelets: 356 10*3/uL (ref 150.0–400.0)
RBC: 4.05 Mil/uL (ref 3.87–5.11)
RDW: 14.1 % (ref 11.5–15.5)
WBC: 9.7 10*3/uL (ref 4.0–10.5)

## 2022-07-18 LAB — COMPREHENSIVE METABOLIC PANEL
ALT: 16 U/L (ref 0–35)
AST: 13 U/L (ref 0–37)
Albumin: 4.2 g/dL (ref 3.5–5.2)
Alkaline Phosphatase: 70 U/L (ref 39–117)
BUN: 16 mg/dL (ref 6–23)
CO2: 25 mEq/L (ref 19–32)
Calcium: 9.1 mg/dL (ref 8.4–10.5)
Chloride: 102 mEq/L (ref 96–112)
Creatinine, Ser: 0.69 mg/dL (ref 0.40–1.20)
GFR: 106.47 mL/min (ref >60.00)
Glucose, Bld: 104 mg/dL — ABNORMAL HIGH (ref 70–99)
Potassium: 4.8 mEq/L (ref 3.5–5.1)
Sodium: 135 mEq/L (ref 135–145)
Total Bilirubin: 0.5 mg/dL (ref 0.2–1.2)
Total Protein: 6.9 g/dL (ref 6.0–8.3)

## 2022-07-18 LAB — TSH: TSH: 2.89 u[IU]/mL (ref 0.35–5.50)

## 2022-07-18 LAB — LIPID PANEL
Cholesterol: 187 mg/dL (ref 0–200)
HDL: 40.3 mg/dL (ref >39.00)
LDL Cholesterol: 122 mg/dL — ABNORMAL HIGH (ref 0–99)
NonHDL: 146.35
Total CHOL/HDL Ratio: 5
Triglycerides: 124 mg/dL (ref 0.0–149.0)
VLDL: 24.8 mg/dL (ref 0.0–40.0)

## 2022-07-18 NOTE — Progress Notes (Signed)
Pt came for lab visit, tolerated draw well. 

## 2022-07-22 ENCOUNTER — Encounter: Payer: Self-pay | Admitting: Family Medicine

## 2022-07-24 ENCOUNTER — Other Ambulatory Visit: Payer: Self-pay

## 2022-07-24 MED ORDER — DILTIAZEM HCL ER COATED BEADS 300 MG PO CP24: 300.0000 mg | ORAL_CAPSULE | Freq: Every day | ORAL | 1 refills | Status: DC

## 2022-07-24 NOTE — Telephone Encounter (Signed)
RF request for diltiazem (CARDIZEM CD) 300 MG 24 hr capsule  LOV: 07/13/22 Next ov: 01/12/23 Last written: 01/20/22 (90,1)

## 2022-07-27 ENCOUNTER — Telehealth: Payer: Self-pay

## 2022-07-27 NOTE — Telephone Encounter (Signed)
Patient would like to know if she would be a candidate to get B12 injections.  Please advise [phone]

## 2022-07-27 NOTE — Telephone Encounter (Signed)
Please advised regarding b12 level check.

## 2022-07-28 ENCOUNTER — Other Ambulatory Visit: Payer: Self-pay

## 2022-07-28 DIAGNOSIS — E538 Deficiency of other specified B group vitamins: Secondary | ICD-10-CM

## 2022-07-28 NOTE — Telephone Encounter (Signed)
Sent pt a Mychart.

## 2022-07-28 NOTE — Telephone Encounter (Signed)
Okay for lab visit for vitamin B12 level, diagnosis vitamin B12 deficiency.

## 2022-08-02 ENCOUNTER — Other Ambulatory Visit: Payer: BC Managed Care – PPO

## 2022-08-09 ENCOUNTER — Other Ambulatory Visit: Payer: BC Managed Care – PPO

## 2022-08-11 ENCOUNTER — Other Ambulatory Visit: Payer: BC Managed Care – PPO

## 2022-08-11 DIAGNOSIS — E538 Deficiency of other specified B group vitamins: Secondary | ICD-10-CM

## 2022-08-11 NOTE — Progress Notes (Signed)
Pt came for labs only, tolerated draw well.   

## 2022-08-11 NOTE — Addendum Note (Signed)
Addended by: Emi Holes D on: 08/11/2022 02:22 PM   Modules accepted: Orders

## 2022-08-14 ENCOUNTER — Telehealth: Payer: Self-pay | Admitting: Family Medicine

## 2022-08-14 NOTE — Telephone Encounter (Signed)
Patient aware of results and recommendations. She had no additional questions or concerns.

## 2022-08-14 NOTE — Telephone Encounter (Signed)
Noted  

## 2022-08-15 ENCOUNTER — Telehealth: Payer: Self-pay | Admitting: Family Medicine

## 2022-08-15 MED ORDER — FLUCONAZOLE 150 MG PO TABS: ORAL_TABLET | ORAL | 0 refills | Status: DC

## 2022-08-15 NOTE — Telephone Encounter (Signed)
Patient called requesting a medication fluconazole (DIFLUCAN) 150 MG tablet she was prescribed months ago for a yeast infection. Informed patient an appointment would need to be scheduled to confirm she does have a yeast infection. She continued to say Sr. McGowen called it in last time consider she lives a distance from the office.  The pharmacy is [facility] located on [address]

## 2022-08-15 NOTE — Telephone Encounter (Signed)
Ok, fluconazole rx sent

## 2022-08-16 ENCOUNTER — Telehealth: Payer: Self-pay | Admitting: Family Medicine

## 2022-08-16 NOTE — Telephone Encounter (Signed)
I do not prescribe B12 injections without a clinical indication.

## 2022-08-16 NOTE — Telephone Encounter (Signed)
Pt was advised based on 7/26 labs,  "Your vitamin b12 level is good. You are not a candidate for B12 injections but it would be fine for you to take otc vit B12 supplement, one 1000 mcg tab daily."

## 2022-08-16 NOTE — Telephone Encounter (Signed)
Patient would like to discuss B 12 injections. She would like to know although she is not a candidate for them she would potentially still like to receive them. Please give the patient a call back to advise.

## 2022-08-17 NOTE — Telephone Encounter (Signed)
Left pt a vm to return call and mychart message sent.

## 2022-08-23 ENCOUNTER — Inpatient Hospital Stay: Admission: RE | Admit: 2022-08-23 | Payer: BC Managed Care – PPO | Source: Ambulatory Visit

## 2022-10-30 ENCOUNTER — Ambulatory Visit: Payer: BC Managed Care – PPO | Admitting: Family Medicine

## 2022-11-02 ENCOUNTER — Ambulatory Visit: Payer: BC Managed Care – PPO | Admitting: Family Medicine

## 2022-12-01 IMAGING — MG MM DIGITAL SCREENING BILAT W/ TOMO AND CAD
8 series · 8 of 24 positions shown · non-contrast
Comparison: Previous exam(s).

CLINICAL DATA: Screening.

EXAM:
DIGITAL SCREENING BILATERAL MAMMOGRAM WITH TOMOSYNTHESIS AND CAD
TECHNIQUE: Bilateral screening digital craniocaudal and mediolateral oblique
mammograms were obtained. Bilateral screening digital breast
tomosynthesis was performed. The images were evaluated with
computer-aided detection.

[R MLO synth-2D]
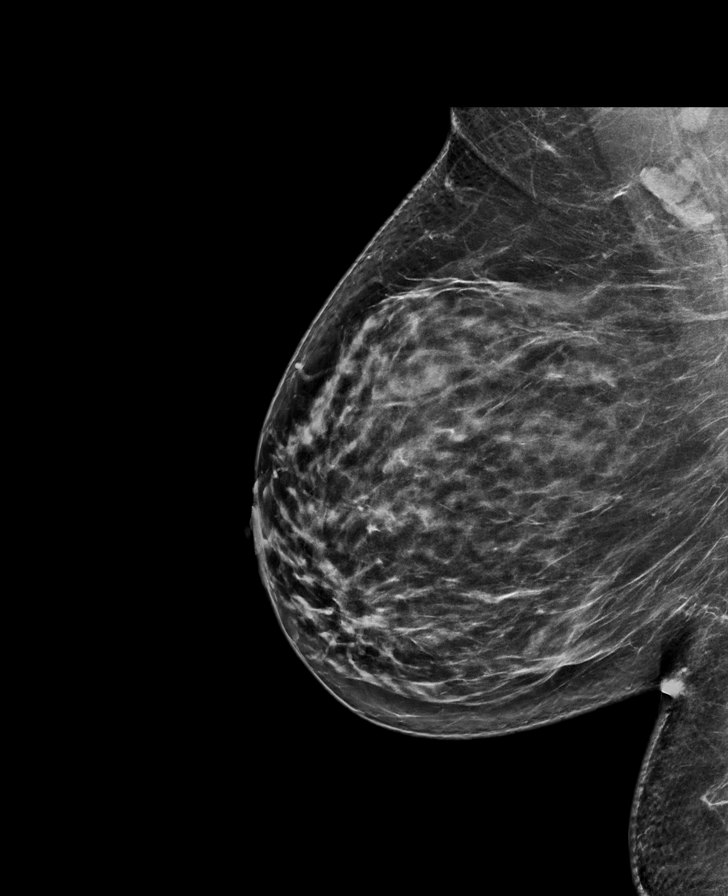

[L MLO synth-2D]
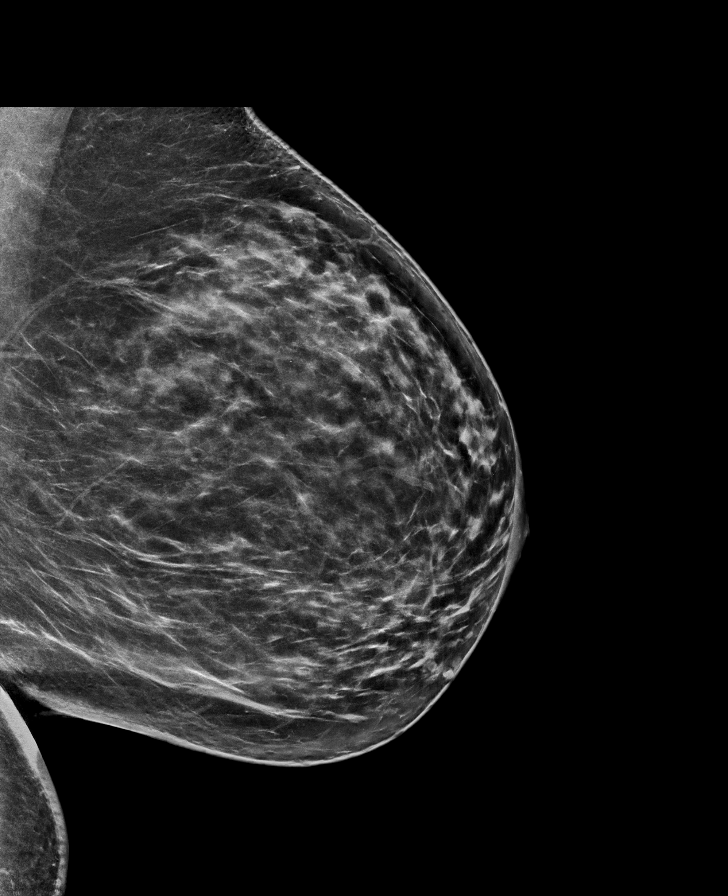

[R CC synth-2D]
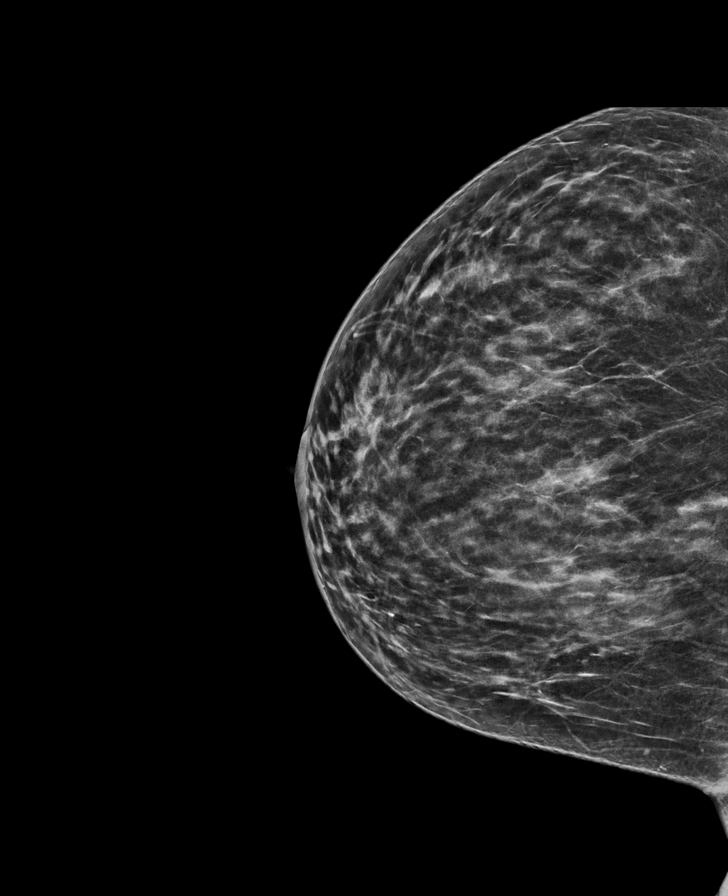

[L CC synth-2D]
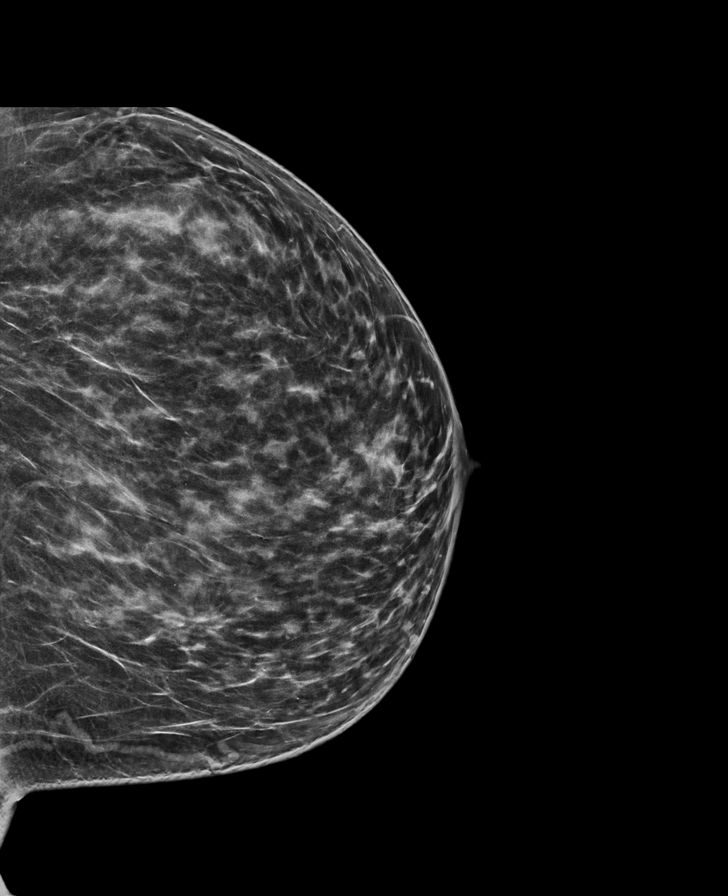

[R MLO tomo · tomo slice 37/74.0]
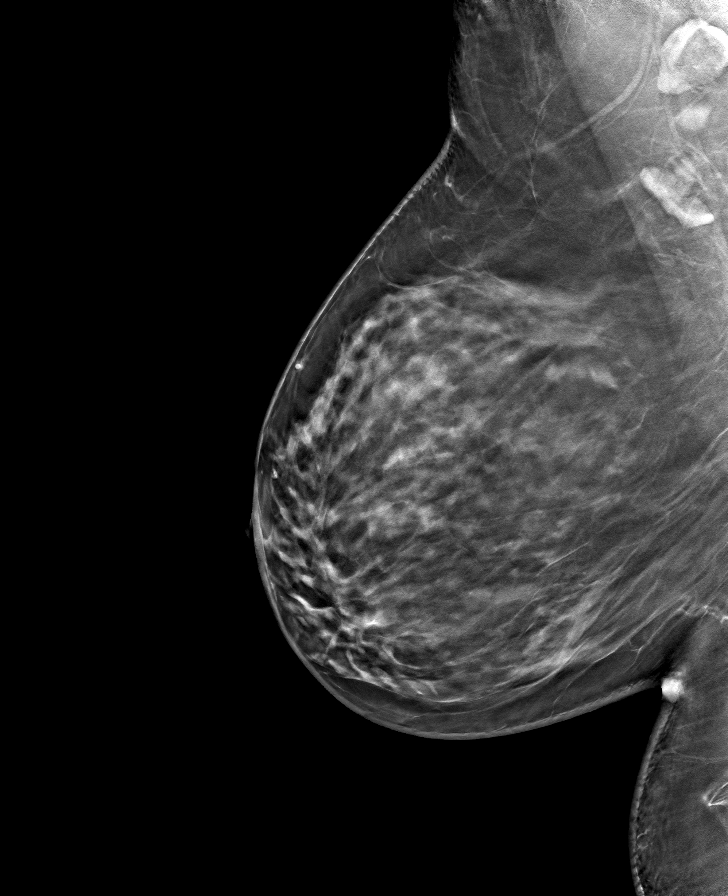

[R CC tomo · tomo slice 29/56.0]
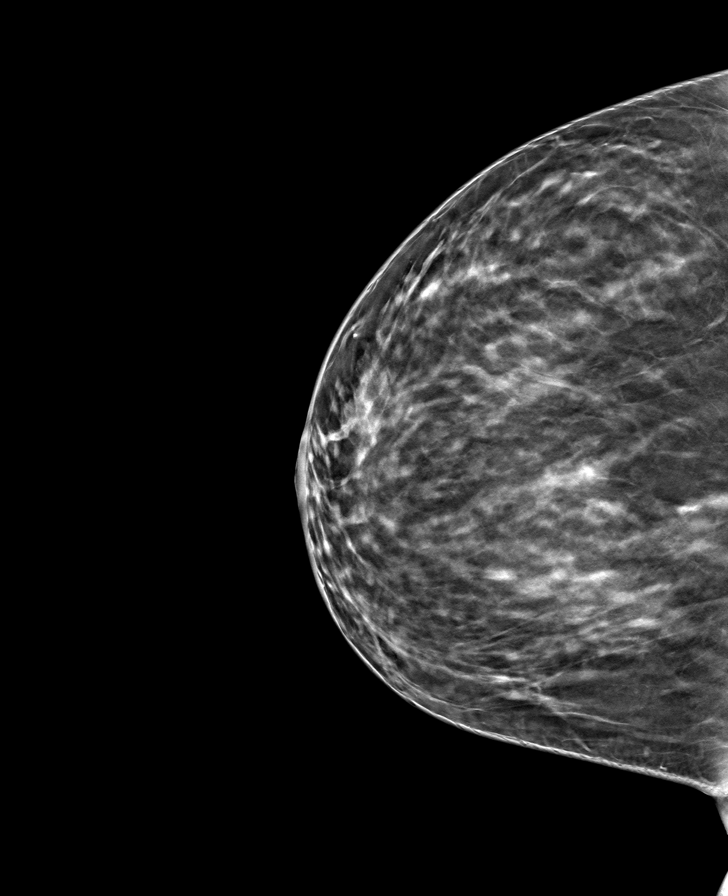

[L MLO tomo · tomo slice 37/74.0]
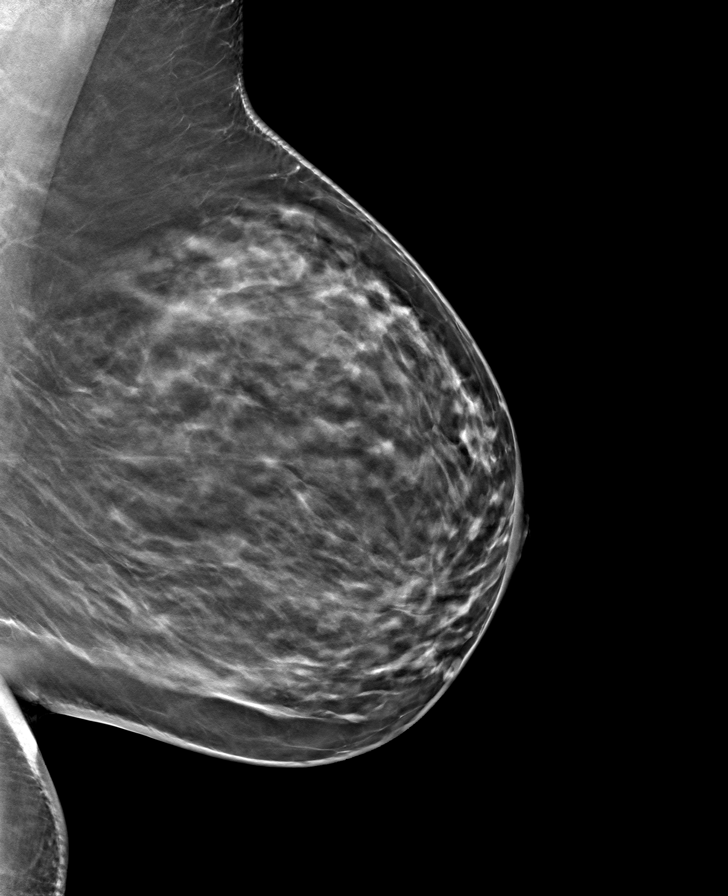

[L CC tomo · tomo slice 33/66.0]
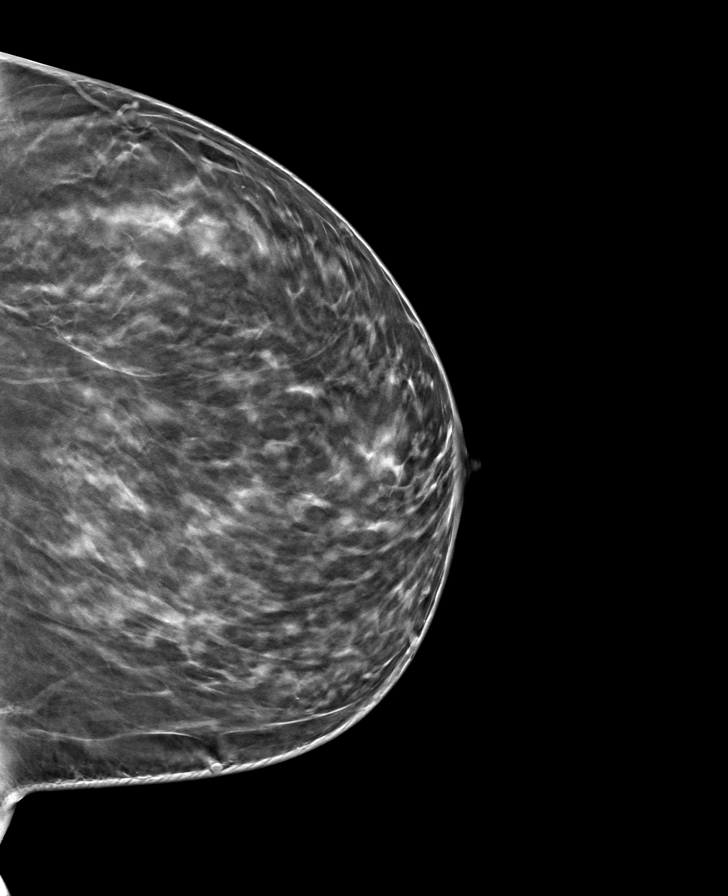

[8 of 24 positions shown; findings below may reference images not displayed]

ACR Breast Density Category c: The breast tissue is heterogeneously
dense, which may obscure small masses.
FINDINGS: In the right breast , a possible distortion requires further
evaluation. This possible distortion is seen within the upper RIGHT
breast at posterior depth, MLO view only, slice 48.

In the left breast , a possible asymmetry and possible distortion
requires further evaluation. The possible asymmetry is seen within
the outer LEFT breast, cc view only, slice 20. The possible
distortion is seen within the upper LEFT breast at posterior depth,
MLO view only, slice 50.
IMPRESSION: Further evaluation is suggested for possible distortion in the right
breast.

Further evaluation is suggested for possible asymmetry and possible
distortion in the left breast.

RECOMMENDATION:
Diagnostic mammogram and possibly ultrasound of both breasts.
(Code:[id number]-[id number])

The patient will be contacted regarding the findings, and additional
imaging will be scheduled.

BI-RADS CATEGORY  0: Incomplete. Need additional imaging evaluation
and/or prior mammograms for comparison.

## 2022-12-07 ENCOUNTER — Telehealth: Payer: Self-pay

## 2022-12-07 MED ORDER — VALSARTAN 80 MG PO TABS: 80.0000 mg | ORAL_TABLET | Freq: Every day | ORAL | 1 refills | Status: AC

## 2022-12-07 NOTE — Telephone Encounter (Signed)
Refill sent.

## 2022-12-07 NOTE — Telephone Encounter (Signed)
Prescription Request  12/07/2022  LOV: Visit date not found  What is the name of the medication or equipment? valsartan (DIOVAN) 80 MG tablet   Have you contacted your pharmacy to request a refill? Yes - [facility]  Which pharmacy would you like this sent to?  [id number] [place], [place] - [address] AT [facility] [address] [facility] [address] [place] [address] Phone: [phone] Fax: [phone]    Patient notified that their request is being sent to the clinical staff for review and that they should receive a response within 2 business days.   Please advise at [facility] [phone]

## 2022-12-22 ENCOUNTER — Other Ambulatory Visit: Payer: Self-pay | Admitting: Family Medicine

## 2022-12-22 DIAGNOSIS — Z1231 Encounter for screening mammogram for malignant neoplasm of breast: Secondary | ICD-10-CM

## 2022-12-26 ENCOUNTER — Ambulatory Visit: Payer: BC Managed Care – PPO | Admitting: Family Medicine

## 2023-01-12 ENCOUNTER — Ambulatory Visit: Payer: BC Managed Care – PPO | Admitting: Family Medicine

## 2023-01-23 DIAGNOSIS — Z1231 Encounter for screening mammogram for malignant neoplasm of breast: Secondary | ICD-10-CM

## 2023-01-30 ENCOUNTER — Other Ambulatory Visit: Payer: Self-pay | Admitting: Family Medicine

## 2023-01-30 NOTE — Telephone Encounter (Signed)
 Copied from CRM [id number]. Topic: Clinical - Medication Refill >> Jan 30, 2023  4:43 PM Mercedes MATSU wrote: Most Recent Primary Care Visit:  Provider: [facility]  Department: [facility]  Visit Type: LAB  Date: 08/11/2022  Medication: diltiazem  (CARDIZEM  CD) 300 MG 24 hr capsule  Has the patient contacted their pharmacy? Yes (Agent: If no, request that the patient contact the pharmacy for the refill. If patient does not wish to contact the pharmacy document the reason why and proceed with request.) (Agent: If yes, when and what did the pharmacy advise?)  Is this the correct pharmacy for this prescription? Yes If no, delete pharmacy and type the correct one.  This is the patient's preferred pharmacy:  [facility] [phone] - [place], [place] - [phone] AT [facility] [address] [facility] [address] [place] [address] Phone: [phone] Fax: [phone]   Has the prescription been filled recently? Yes  Is the patient out of the medication? Yes  Has the patient been seen for an appointment in the last year OR does the patient have an upcoming appointment? Yes  Can we respond through MyChart? Yes  Agent: Please be advised that Rx refills may take up to 3 business days. We ask that you follow-up with your pharmacy.

## 2023-01-31 ENCOUNTER — Ambulatory Visit: Payer: 59

## 2023-01-31 ENCOUNTER — Telehealth: Payer: 59

## 2023-01-31 MED ORDER — DILTIAZEM HCL ER COATED BEADS 300 MG PO CP24: 300.0000 mg | ORAL_CAPSULE | Freq: Every day | ORAL | 0 refills | Status: DC

## 2023-01-31 NOTE — Telephone Encounter (Signed)
 Patient is calling back in she needs her medication for her bp filled

## 2023-02-02 ENCOUNTER — Other Ambulatory Visit: Payer: Self-pay

## 2023-02-02 MED ORDER — DILTIAZEM HCL ER COATED BEADS 300 MG PO CP24: 300.0000 mg | ORAL_CAPSULE | Freq: Every day | ORAL | 0 refills | Status: DC

## 2023-02-05 ENCOUNTER — Ambulatory Visit: Payer: 59 | Admitting: Family Medicine

## 2023-02-09 ENCOUNTER — Ambulatory Visit (INDEPENDENT_AMBULATORY_CARE_PROVIDER_SITE_OTHER): Payer: 59 | Admitting: Family Medicine

## 2023-02-09 ENCOUNTER — Encounter: Payer: Self-pay | Admitting: Family Medicine

## 2023-02-09 VITALS — BP 137/82 | HR 90 | Temp 98.7°F | Wt 230.2 lb

## 2023-02-09 DIAGNOSIS — E039 Hypothyroidism, unspecified: Secondary | ICD-10-CM | POA: Diagnosis not present

## 2023-02-09 DIAGNOSIS — I10 Essential (primary) hypertension: Secondary | ICD-10-CM | POA: Diagnosis not present

## 2023-02-09 DIAGNOSIS — R7301 Impaired fasting glucose: Secondary | ICD-10-CM

## 2023-02-09 MED ORDER — LEVOTHYROXINE SODIUM 100 MCG PO TABS: ORAL_TABLET | ORAL | 3 refills | Status: AC

## 2023-02-09 MED ORDER — DILTIAZEM HCL ER COATED BEADS 300 MG PO CP24: 300.0000 mg | ORAL_CAPSULE | Freq: Every day | ORAL | 3 refills | Status: DC

## 2023-02-09 NOTE — Progress Notes (Signed)
OFFICE VISIT  02/09/2023  CC:  Chief Complaint  Patient presents with   Medical Management of Chronic Issues    6 month follow up . Hot flashes    Patient is a 42 y.o. female who presents for 43-month follow-up hypertension and hypothyroidism. A/P as of last visit: "1 hypertension, well-controlled on valsartan 80 mg a day and diltiazem CD 300 mg a day. Electrolytes and creatinine today.   2.  Acquired hypothyroidism.  She has done well on levothyroxine 100 mcg every morning. TSH monitoring today.   3.  Episodic dizziness/presyncope. Unknown etiology. Reassuring cardiology and ENT evaluations in the past. For now, she will continue to simply use swimmer's ear drops after every shower since she states she never has the episodes when she does this. Observe for any new or prolonged symptoms and call if these occur."  INTERIM HX: Nancy Murphy feels well. No home blood pressure monitoring.  She has been feeling hot flashes lately, approximately last 6 weeks or so, getting a little more frequent and initially at nighttime but now the daytime as well. No cold intolerance or heat intolerance. No fatigue, hair loss, tremulousness, diarrhea, or constipation.  Of note, she was switched over from Nexplanon to progesterone only pills by her GYN MD a couple months ago.  Sounds like she has had irregular menses but not entirely clear.   Past Medical History:  Diagnosis Date   Arthralgia    both wrists and left knee intermittently   Essential hypertension    Hay fever    Hypertriglyceridemia    Hypothyroidism    IFG (impaired fasting glucose)    Incomplete RBBB    Prediabetes    a1c 5.8% 2022    Past Surgical History:  Procedure Laterality Date   nexplanon implant     10/2020.  Patient states this was placed to induce amenorrhea   TRANSTHORACIC ECHOCARDIOGRAM  12/01/2020   EF 55-60%, borderline LVH, diast dysf    Outpatient Medications Prior to Visit  Medication Sig Dispense Refill    Multiple Vitamins-Minerals (ONE DAILY MULTIVITAMIN WOMEN PO) Take by mouth daily.     valsartan (DIOVAN) 80 MG tablet Take 1 tablet (80 mg total) by mouth daily. 90 tablet 1   fluconazole (DIFLUCAN) 150 MG tablet 1 tab po qd x 1 dose 1 tablet 0   ASPIRIN LOW DOSE PO Take 81 mg by mouth daily. (Patient not taking: Reported on 02/09/2023)     diltiazem (CARDIZEM CD) 300 MG 24 hr capsule Take 1 capsule (300 mg total) by mouth daily. 30 capsule 0   levothyroxine (SYNTHROID) 100 MCG tablet TAKE 1 TABLET(100 MCG) BY MOUTH EVERY MORNING 90 tablet 1   No facility-administered medications prior to visit.    No Known Allergies  Review of Systems As per HPI  PE:    02/09/2023    2:54 PM 07/13/2022    1:08 PM 01/27/2022    9:37 AM  Vitals with BMI  Height  5\' 6"  5\' 5"   Weight 230 lbs 3 oz 223 lbs 10 oz 227 lbs 6 oz  BMI  36.11 37.84  Systolic 137 138 914  Diastolic 82 85 82  Pulse 90 70 85     Physical Exam  Gen: Alert, well appearing.  Patient is oriented to person, place, time, and situation. AFFECT: pleasant, lucid thought and speech. No further exam today  LABS:  Last CBC Lab Results  Component Value Date   WBC 9.7 07/18/2022   HGB 12.3 07/18/2022  HCT 37.2 07/18/2022   MCV 91.7 07/18/2022   RDW 14.1 07/18/2022   PLT 356.0 07/18/2022   Last metabolic panel Lab Results  Component Value Date   GLUCOSE 104 (H) 07/18/2022   NA 135 07/18/2022   K 4.8 07/18/2022   CL 102 07/18/2022   CO2 25 07/18/2022   BUN 16 07/18/2022   CREATININE 0.69 07/18/2022   GFR 106.47 07/18/2022   CALCIUM 9.1 07/18/2022   PROT 6.9 07/18/2022   ALBUMIN 4.2 07/18/2022   BILITOT 0.5 07/18/2022   ALKPHOS 70 07/18/2022   AST 13 07/18/2022   ALT 16 07/18/2022   Last lipids Lab Results  Component Value Date   CHOL 187 07/18/2022   HDL 40.30 07/18/2022   LDLCALC 122 (H) 07/18/2022   TRIG 124.0 07/18/2022   CHOLHDL 5 07/18/2022   Last hemoglobin A1c Lab Results  Component Value Date    HGBA1C 5.4 05/19/2021   Last thyroid functions Lab Results  Component Value Date   TSH 2.89 07/18/2022   Last vitamin B12 and Folate Lab Results  Component Value Date   VITAMINB12 399 08/11/2022   IMPRESSION AND PLAN:  #1 hypertension, well-controlled on valsartan 80 mg a day and Cardizem CD 300 mg a day. Encouraged her to improve her diet and to get started into daily exercise again. Electrolytes and creatinine today.  2.  Acquired hypothyroidism, doing well on Synthroid 100 mcg daily. TSH today.  She describes vasomotor symptoms.  She will be getting back into see her GYN.  #3 IFG. Checking random glucose today as well as hemoglobin A1c today.  An After Visit Summary was printed and given to the patient.  FOLLOW UP: Return in about 6 months (around 08/09/2023) for annual CPE (fasting).  Signed:  Santiago Bumpers, MD           02/09/2023

## 2023-02-10 LAB — BASIC METABOLIC PANEL
BUN: 12 mg/dL (ref 7–25)
CO2: 27 mmol/L (ref 20–32)
Calcium: 9.7 mg/dL (ref 8.6–10.2)
Chloride: 105 mmol/L (ref 98–110)
Creat: 0.61 mg/dL (ref 0.50–0.99)
Glucose, Bld: 99 mg/dL (ref 65–99)
Potassium: 4.4 mmol/L (ref 3.5–5.3)
Sodium: 141 mmol/L (ref 135–146)

## 2023-02-10 LAB — HEMOGLOBIN A1C
Hgb A1c MFr Bld: 5.8 %{Hb} — ABNORMAL HIGH (ref <5.7)
Mean Plasma Glucose: 120 mg/dL
eAG (mmol/L): 6.6 mmol/L

## 2023-02-10 LAB — TSH: TSH: 0.68 m[IU]/L

## 2023-02-11 ENCOUNTER — Encounter: Payer: Self-pay | Admitting: Family Medicine

## 2023-02-15 ENCOUNTER — Telehealth: Payer: Self-pay

## 2023-02-15 MED ORDER — TIRZEPATIDE-WEIGHT MANAGEMENT 2.5 MG/0.5ML ~~LOC~~ SOLN: 2.5000 mg | SUBCUTANEOUS | 0 refills | Status: DC

## 2023-02-15 NOTE — Telephone Encounter (Signed)
Pt wants to see if there is something she could take for an increase in hot flashes. She also said she forgot to discuss starting Zepbound for weigh loss at her last appt. Pt was advised that this may require another OV. Please advise

## 2023-02-15 NOTE — Telephone Encounter (Signed)
Copied and pasted CRM  Reason for CRM: patient is having hot flashes and they are getting worse she would like to know if there is some medication she can take for it

## 2023-02-15 NOTE — Telephone Encounter (Signed)
Spoke to pt and she has never been prescribed this medication. Sent message to Dr. Milinda Cave

## 2023-02-15 NOTE — Telephone Encounter (Signed)
She will need to address her hot flashes with her gynecologist.  They are the ones who are currently working with her oral contraceptive pills.  I will prescribe Zepbound but let her know that insurance typically does not cover it unless patient has a known diagnosis of diabetes and has failed certain other medications. I will go ahead and send the prescription in and we will see what they say. If they approve it and she does start the medication that like to see her for follow-up in person or virtual in 1 month.

## 2023-02-15 NOTE — Telephone Encounter (Signed)
Copied and pasted CRM  Medication: zepbound    Has the patient contacted their pharmacy? No  (Agent: If no, request that the patient contact the pharmacy for the refill. If patient does not wish to contact the pharmacy document the reason why and proceed with request.)  (Agent: If yes, when and what did the pharmacy advise?)    Is this the correct pharmacy for this prescription? Yes  If no, delete pharmacy and type the correct one.  This is the patient's preferred pharmacy:  [facility] [phone] - [place], [place] - [phone] [place]  [address]  [place] [address]  Phone: [phone] Fax: [phone]      Has the prescription been filled recently? No    Is the patient out of the medication? No    Has the patient been seen for an appointment in the last year OR does the patient have an upcoming appointment? Yes    Can we respond through MyChart? No    Agent: Please be advised that Rx refills may take up to 3 business days. We ask that you follow-up with your pharmacy.

## 2023-02-16 ENCOUNTER — Ambulatory Visit
Admission: RE | Admit: 2023-02-16 | Discharge: 2023-02-16 | Disposition: A | Discharge status: Home / Self Care | Payer: 59 | Source: Ambulatory Visit | Attending: Family Medicine | Admitting: Family Medicine

## 2023-02-16 DIAGNOSIS — Z1231 Encounter for screening mammogram for malignant neoplasm of breast: Secondary | ICD-10-CM

## 2023-02-16 NOTE — Telephone Encounter (Signed)
 Pt advised.

## 2023-02-20 ENCOUNTER — Telehealth: Payer: Self-pay

## 2023-02-20 NOTE — Telephone Encounter (Signed)
 Copied from CRM [id number]. Topic: Clinical - Prescription Issue >> Feb 19, 2023  3:14 PM Viola F wrote: Reason for CRM: Patient request call back regarding Zepbound  - says it's very expensive and wants to know if Dr. Candise can write letter or send documentation on why this medication is necessary or prescribe something cheaper

## 2023-02-20 NOTE — Telephone Encounter (Signed)
 Please complete PA

## 2023-02-21 NOTE — Telephone Encounter (Signed)
 Spoke with pt and advise we have not gotten a response from PA yet

## 2023-02-21 NOTE — Telephone Encounter (Signed)
 Copied from CRM [id number]. Topic: General - Other >> Feb 20, 2023  3:23 PM Turkey A wrote: Reason for CRM: Patient called again to see if a different weight loss medication can be prescribed since the other one was too expensive.

## 2023-02-23 ENCOUNTER — Telehealth: Payer: Self-pay

## 2023-02-23 ENCOUNTER — Other Ambulatory Visit (HOSPITAL_COMMUNITY): Payer: Self-pay

## 2023-02-23 NOTE — Telephone Encounter (Signed)
 Per test claim: Refill too soon. PA is not needed at this time. Medication was filled 02/15/23. Next eligible fill date is 03/08/23.

## 2023-02-23 NOTE — Telephone Encounter (Signed)
 Pharmacy Patient Advocate Encounter   Received notification from Pt Calls Messages that prior authorization for ZEPBOUND  is required/requested.   Insurance verification completed.   The patient is insured through [employer] .   Per test claim: Refill too soon. PA is not needed at this time. Medication was filled 02/15/23. Next eligible fill date is 03/08/23.

## 2023-02-26 ENCOUNTER — Ambulatory Visit: Payer: 59 | Admitting: Family Medicine

## 2023-02-26 ENCOUNTER — Encounter: Payer: Self-pay | Admitting: Family Medicine

## 2023-02-26 VITALS — BP 128/82 | HR 112 | Temp 100.4°F | Ht 66.0 in | Wt 232.4 lb

## 2023-02-26 DIAGNOSIS — R509 Fever, unspecified: Secondary | ICD-10-CM

## 2023-02-26 DIAGNOSIS — R5081 Fever presenting with conditions classified elsewhere: Secondary | ICD-10-CM | POA: Diagnosis not present

## 2023-02-26 DIAGNOSIS — J111 Influenza due to unidentified influenza virus with other respiratory manifestations: Secondary | ICD-10-CM

## 2023-02-26 LAB — POCT INFLUENZA A/B
Influenza A, POC: NEGATIVE
Influenza B, POC: NEGATIVE

## 2023-02-26 MED ORDER — OSELTAMIVIR PHOSPHATE 75 MG PO CAPS: 75.0000 mg | ORAL_CAPSULE | Freq: Two times a day (BID) | ORAL | 0 refills | Status: AC

## 2023-02-26 NOTE — Patient Instructions (Signed)
 Take over the counter nasal spray called afrin (generic is fine), 2 sprays each nostril every 12 hours as needed for nasal congestion.

## 2023-02-26 NOTE — Progress Notes (Signed)
 OFFICE VISIT  02/26/2023  CC:  Chief Complaint  Patient presents with   Nasal Congestion    Pt also c/o head and chest congestion, productive cough with yellow phlegm, body aches/chills starting Fri. Has tried using Vicks    Patient is a 43 y.o. female who presents for cough.  HPI: Onset 2 d/a, cough, HA, fever, ST, body aches, fatigue, nasal congestion. No n/v/d.   Past Medical History:  Diagnosis Date   Arthralgia    both wrists and left knee intermittently   Essential hypertension    Hay fever    Hypertriglyceridemia    Hypothyroidism    IFG (impaired fasting glucose)    Incomplete RBBB    Prediabetes    a1c 5.8% 2022    Past Surgical History:  Procedure Laterality Date   nexplanon implant     10/2020.  Patient states this was placed to induce amenorrhea   TRANSTHORACIC ECHOCARDIOGRAM  12/01/2020   EF 55-60%, borderline LVH, diast dysf    Outpatient Medications Prior to Visit  Medication Sig Dispense Refill   diltiazem  (CARDIZEM  CD) 300 MG 24 hr capsule Take 1 capsule (300 mg total) by mouth daily. 90 capsule 3   levothyroxine  (SYNTHROID ) 100 MCG tablet TAKE 1 TABLET(100 MCG) BY MOUTH EVERY MORNING 90 tablet 3   Multiple Vitamins-Minerals (ONE DAILY MULTIVITAMIN WOMEN PO) Take by mouth daily.     tirzepatide (ZEPBOUND) 2.5 MG/0.5ML injection vial Inject 2.5 mg into the skin once a week. 2 mL 0   valsartan  (DIOVAN ) 80 MG tablet Take 1 tablet (80 mg total) by mouth daily. 90 tablet 1   No facility-administered medications prior to visit.    No Known Allergies  Review of Systems  As per HPI  PE:    02/26/2023    3:45 PM 02/09/2023    2:54 PM 07/13/2022    1:08 PM  Vitals with BMI  Height 5\' 6"   5\' 6"   Weight 232 lbs 6 oz 230 lbs 3 oz 223 lbs 10 oz  BMI 37.53  36.11  Systolic 128 137 784  Diastolic 82 82 85  Pulse 112 90 70     Physical Exam  VS: noted--normal. Gen: alert, NAD, NONTOXIC APPEARING. HEENT: eyes without injection, drainage, or  swelling.  Ears: EACs clear, TMs with normal light reflex and landmarks.  Nose: Clear rhinorrhea, with some dried, crusty exudate adherent to mildly injected mucosa.  No purulent d/c.  No paranasal sinus TTP.  No facial swelling.  Throat and mouth without focal lesion.  No pharyngial swelling, erythema, or exudate.   Neck: supple, no LAD.   LUNGS: CTA bilat, nonlabored resps.   CV: RRR, no m/r/g. EXT: no c/c/e SKIN: no rash   LABS:  Last metabolic panel Lab Results  Component Value Date   GLUCOSE 99 02/09/2023   NA 141 02/09/2023   K 4.4 02/09/2023   CL 105 02/09/2023   CO2 27 02/09/2023   BUN 12 02/09/2023   CREATININE 0.61 02/09/2023   GFR 106.47 07/18/2022   CALCIUM 9.7 02/09/2023   PROT 6.9 07/18/2022   ALBUMIN 4.2 07/18/2022   BILITOT 0.5 07/18/2022   ALKPHOS 70 07/18/2022   AST 13 07/18/2022   ALT 16 07/18/2022   Flu A/B NEG today  IMPRESSION AND PLAN:  Influenza-like illness.  Rapid flu testing here today is negative. This is the height of flu season and she has direct exposure to people at work with influenza. Will treat with Tamiflu  75 mg twice  daily x 5 days. Over-the-counter symptomatic care reviewed/discussed.  An After Visit Summary was printed and given to the patient.  FOLLOW UP: No follow-ups on file.  Signed:  Arletha Lady, MD           02/26/2023

## 2023-02-27 ENCOUNTER — Encounter: Payer: Self-pay | Admitting: Family Medicine

## 2023-02-27 MED ORDER — HYDROCODONE-ACETAMINOPHEN 5-325 MG PO TABS: 1.0000 | ORAL_TABLET | Freq: Four times a day (QID) | ORAL | 0 refills | Status: AC | PRN

## 2023-02-27 MED ORDER — HYDROCODONE-ACETAMINOPHEN 5-325 MG PO TABS: 1.0000 | ORAL_TABLET | Freq: Four times a day (QID) | ORAL | 0 refills | Status: DC | PRN

## 2023-02-27 NOTE — Telephone Encounter (Signed)
This is a viral infection.  Z-Pak's and other antibiotics treat bacteria, not viruses. Hydrocodone pain medicine for a couple of days will help while your body clears the worst of this out. Continue Tamiflu. I will send in hydrocodone tabs now.

## 2023-03-01 ENCOUNTER — Other Ambulatory Visit: Payer: Self-pay | Admitting: Family Medicine

## 2023-03-05 ENCOUNTER — Telehealth: Payer: Self-pay

## 2023-03-05 MED ORDER — DILTIAZEM HCL ER COATED BEADS 300 MG PO CP24: 300.0000 mg | ORAL_CAPSULE | Freq: Every day | ORAL | 3 refills | Status: AC

## 2023-03-05 NOTE — Telephone Encounter (Signed)
Copied from CRM [id number]. Topic: Clinical - Prescription Issue >> Mar 05, 2023  1:33 PM Corin V wrote: Reason for CRM: Patient stated she is out of her diltiazem (CARDIZEM CD) 300 MG 24 hr capsule prescription and the pharmacy does not have the script that was sent over on 02/09/23. Please resend this script as she has not been able to take this Rx today.

## 2023-03-09 ENCOUNTER — Ambulatory Visit: Payer: Self-pay | Admitting: Family Medicine

## 2023-03-09 NOTE — Telephone Encounter (Signed)
Chief Complaint: sore throat Symptoms: sore throat, cough with green mucus Frequency: seen in the office 2/10, sore throat today Pertinent Negatives: Patient denies fever, N/V Disposition: [] ED /[] Urgent Care (no appt availability in office) / [x] Appointment(In office/virtual)/ []  [facility]/ [] Home Care/ [] Refused Recommended Disposition /[] [facility]/ []  Follow-up with PCP Additional Notes: Pt reports sore throat and cough. Seen on 2/10 for flu-like symptoms. Pt tested neg for the flu but had many sick contacts flu positive so she was prescribed Tamiflu for 5 days. States it helped but now she feels worse and is not getting better. No pus or patches on her tonsils. States the cough is most severe at night with yellow mucus/drainage. No SOB, CP, fever, N/V. Does endorse an episode of lightheadedness yesterday that resolved quickly. Per protocol RN scheduled pt for Monday 2/24 at 1440 and gave pt homecare advice in the meantime. Pt agreeable to that plan. RN advised pt to call us back for any worsening to which she verbalized understanding.   Copied from CRM [id number]. Topic: Clinical - Red Word Triage >> Mar 09, 2023 12:55 PM Larwance Sachs wrote: Red Word that prompted transfer to Nurse Triage: Patient called in regarding being treated in the clinic for the flu over 2 weeks ago, stated symptoms are worsening has a painful sore throat as well as a very deep cough Reason for Disposition  [1] Nasal discharge AND [2] present > 10 days  Cough  Answer Assessment - Initial Assessment Questions 1. ONSET: "When did the cough begin?"      2/10 2. SEVERITY: "How bad is the cough today?"      Not too bad 3. SPUTUM: "Describe the color of your sputum" (none, dry cough; clear, white, yellow, green)     Yellow 4. HEMOPTYSIS: "Are you coughing up any blood?" If so ask: "How much?" (flecks, streaks, tablespoons, etc.)     No 5. DIFFICULTY BREATHING: "Are you having difficulty breathing?"  If Yes, ask: "How bad is it?" (e.g., mild, moderate, severe)    - MILD: No SOB at rest, mild SOB with walking, speaks normally in sentences, can lie down, no retractions, pulse < 100.    - MODERATE: SOB at rest, SOB with minimal exertion and prefers to sit, cannot lie down flat, speaks in phrases, mild retractions, audible wheezing, pulse 100-120.    - SEVERE: Very SOB at rest, speaks in single words, struggling to breathe, sitting hunched forward, retractions, pulse > 120      No 6. FEVER: "Do you have a fever?" If Yes, ask: "What is your temperature, how was it measured, and when did it start?"     No 7. CARDIAC HISTORY: "Do you have any history of heart disease?" (e.g., heart attack, congestive heart failure)      HTN, ventricular hypertrophy 8. LUNG HISTORY: "Do you have any history of lung disease?"  (e.g., pulmonary embolus, asthma, emphysema)     No  9. PE RISK FACTORS: "Do you have a history of blood clots?" (or: recent major surgery, recent prolonged travel, bedridden)     No 10. OTHER SYMPTOMS: "Do you have any other symptoms?" (e.g., runny nose, wheezing, chest pain)       Productive cough and sore throat  Answer Assessment - Initial Assessment Questions 1. ONSET: "When did the throat start hurting?" (Hours or days ago)      Woke up today with a sore throat, 2/10 office visit and treated with Tamiflu 2. SEVERITY: "How  bad is the sore throat?" (Scale 1-10; mild, moderate or severe)   - MILD (1-3):  Doesn't interfere with eating or normal activities.   - MODERATE (4-7): Interferes with eating some solids and normal activities.   - SEVERE (8-10):  Excruciating pain, interferes with most normal activities.   - SEVERE WITH DYSPHAGIA (10): Can't swallow liquids, drooling.     6/10 - able to eat and drink normally  3. STREP EXPOSURE: "Has there been any exposure to strep within the past week?" If Yes, ask: "What type of contact occurred?"      No 4.  VIRAL SYMPTOMS: "Are there any  symptoms of a cold, such as a runny nose, cough, hoarse voice or red eyes?"      Productive cough with yellow mucus, lightheaded yesterday but has since passed, no CP, no fever. Cough worse at night. Throat is red, no white patches on the tonsils. 5. FEVER: "Do you have a fever?" If Yes, ask: "What is your temperature, how was it measured, and when did it start?"     No 6. PUS ON THE TONSILS: "Is there pus on the tonsils in the back of your throat?"     No 7. OTHER SYMPTOMS: "Do you have any other symptoms?" (e.g., difficulty breathing, headache, rash)     No  Protocols used: Sore Throat-A-AH, Cough - Acute Productive-A-AH

## 2023-03-09 NOTE — Telephone Encounter (Signed)
 FYI

## 2023-03-12 ENCOUNTER — Ambulatory Visit: Payer: 59 | Admitting: Urgent Care

## 2023-03-12 ENCOUNTER — Encounter: Payer: Self-pay | Admitting: Urgent Care

## 2023-03-12 VITALS — BP 120/78 | HR 84 | Temp 98.5°F

## 2023-03-12 DIAGNOSIS — J22 Unspecified acute lower respiratory infection: Secondary | ICD-10-CM

## 2023-03-12 MED ORDER — PREDNISONE 50 MG PO TABS: 50.0000 mg | ORAL_TABLET | Freq: Every day | ORAL | 0 refills | Status: AC

## 2023-03-12 MED ORDER — AZITHROMYCIN 250 MG PO TABS: ORAL_TABLET | ORAL | 0 refills | Status: AC

## 2023-03-12 NOTE — Patient Instructions (Addendum)
 You have a sinus/ lower respiratory tract infection.  Please start taking the antibiotic Azithromycin per package instruction.   Start taking prednisone once daily in the morning with breakfast to help clear up the mucous. Use Flonase daily to help with inflammation of the nasal passage. It is also recommended that you use nasal saline/ sinus washes to cleans the sinus passages. Hot steam from a shower or vaporizer may also be beneficial to help open up the upper airway. Eucalyptus can be helpful.

## 2023-03-12 NOTE — Progress Notes (Signed)
 Established Patient Office Visit  Subjective:  Patient ID: Nancy Murphy, female    DOB: [birth year]  Age: 43 y.o. MRN: [id number]  Chief Complaint  Patient presents with   URI    Pt has been sick since 2/6 and has not gotten any better. She is still coughing and has a runny nose and is now coughing up yellow phlegm.     HPI  Discussed the use of AI scribe software for clinical note transcription with the patient, who gave verbal consent to proceed.  History of Present Illness   Nancy Murphy is a 43 year old female who presents with persistent cough and nasal congestion following a suspected flu infection.  She began feeling unwell on February 7th, suspecting it was the flu. Despite a negative flu test, she was advised that she likely had the flu due to her symptoms and exposure to others with the illness. She experienced a fever around February 10th, which lasted a couple of days. She was prescribed Tamiflu but her symptoms have persisted.  Her symptoms initially included head congestion but have since progressed to primarily chest congestion. She describes a persistent cough, particularly when lying down to sleep, and is producing yellow phlegm. Initially, the phlegm was white but has since turned yellow. She occasionally noticed what appeared to be blood in the phlegm. No current fever.  She has been using over-the-counter medications, including Afrin for nasal congestion, but only for a couple of days to avoid overuse. She also uses a humidifier and takes hot showers to help alleviate her symptoms.  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 She lives alone and has no known allergies.  In terms of her past medical history, she mentions being pre-diabetic but does not currently monitor her blood sugar.         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  Patient states this was placed to induce amenorrhea   TRANSTHORACIC ECHOCARDIOGRAM  12/01/2020   EF 55-60%, borderline LVH, diast dysf   Social History   Tobacco Use   Smoking status: Never    Passive exposure: Never   Smokeless tobacco: Never  Substance Use Topics   Alcohol use: Yes   Drug use: Never      ROS: as noted in HPI  Objective:     BP 120/78   Pulse 84   Temp 98.5 F (36.9 C) (Oral)   SpO2 96%  BP Readings from Last 3 Encounters:  03/12/23 120/78  02/26/23 128/82  02/09/23 137/82   Wt Readings from Last 3 Encounters:  02/26/23 232 lb 6.4 oz (105.4 kg)  02/09/23 230 lb 3.2 oz (104.4 kg)  07/13/22 223 lb 9.6 oz (101.4 kg)      Physical Exam Vitals and nursing note reviewed.  Constitutional:      General: She is not in  acute distress.    Appearance: Normal appearance. She is not ill-appearing, toxic-appearing or diaphoretic.  HENT:     Head: Normocephalic and atraumatic.     Right Ear: Ear canal and external ear normal. No drainage, swelling or tenderness. A middle ear effusion is present. There is no impacted cerumen. Tympanic membrane is not injected, scarred, perforated or erythematous.     Left Ear: Ear canal and external ear normal. No drainage, swelling or tenderness. A middle ear effusion is present. There is no impacted cerumen. Tympanic membrane is not injected, scarred, perforated or erythematous.     Nose: Congestion and rhinorrhea present. Rhinorrhea is purulent.     Right Turbinates: Enlarged and  swollen.     Left Turbinates: Enlarged and swollen.     Right Sinus: Maxillary sinus tenderness and frontal sinus tenderness present.     Left Sinus: Maxillary sinus tenderness and frontal sinus tenderness present.     Mouth/Throat:     Lips: Pink.     Mouth: Mucous membranes are moist.     Pharynx: Oropharynx is clear. Uvula midline. Postnasal drip present. No pharyngeal swelling, oropharyngeal exudate, posterior oropharyngeal erythema or uvula swelling.  Eyes:     General: No scleral icterus.       Right eye: No discharge.        Left eye: No discharge.     Extraocular Movements: Extraocular movements intact.     Pupils: Pupils are equal, round, and reactive to light.  Cardiovascular:     Rate and Rhythm: Normal rate and regular rhythm.  Pulmonary:     Effort: Pulmonary effort is normal. No respiratory distress.     Breath sounds: Normal breath sounds. No stridor. No wheezing, rhonchi (posterior lung fields, most notable to R middle and L lower lobes) or rales.  Musculoskeletal:     Cervical back: Normal range of motion and neck supple. No rigidity or tenderness.  Lymphadenopathy:     Cervical: No cervical adenopathy.  Skin:    General: Skin is warm and dry.     Coloration: Skin is not jaundiced.     Findings: No bruising, erythema or rash.  Neurological:     General: No focal deficit present.     Mental Status: She is alert and oriented to person, place, and time.     Gait: Gait normal.      No results found for any visits on 03/12/23.  Last CBC Lab Results  Component Value Date   WBC 9.7 07/18/2022   HGB 12.3 07/18/2022   HCT 37.2 07/18/2022   MCV 91.7 07/18/2022   RDW 14.1 07/18/2022   PLT 356.0 07/18/2022   Last metabolic panel Lab Results  Component Value Date   GLUCOSE 99 02/09/2023   NA 141 02/09/2023   K 4.4 02/09/2023   CL 105 02/09/2023   CO2 27 02/09/2023   BUN 12 02/09/2023   CREATININE 0.61 02/09/2023   GFR 106.47 07/18/2022   CALCIUM 9.7  02/09/2023   PROT 6.9 07/18/2022   ALBUMIN 4.2 07/18/2022   BILITOT 0.5 07/18/2022   ALKPHOS 70 07/18/2022   AST 13 07/18/2022   ALT 16 07/18/2022      The 10-year ASCVD risk score (Arnett DK, et al., 2019) is: 1.3%  Assessment & Plan:  Lower respiratory infection -     Azithromycin; Take 2 tablets on day 1, then 1 tablet daily on days 2 through 5  Dispense: 6 tablet; Refill: 0 -  predniSONE; Take 1 tablet (50 mg total) by mouth daily with breakfast.  Dispense: 5 tablet; Refill: 0   Assessment and Plan    Post-Influenza Bronchitis Persistent cough and yellow sputum production 14 days after initial flu-like symptoms. Total cough present for 21+ days. No fever. Auscultation revealed ronchi. Concern for secondary bacterial infection, possibly mycoplasma pneumonia, given the patient's exposure in a prison setting. -Start Azithromycin for suspected mycoplasma pneumonia. -Start Prednisone for inflammation and mucus production, to be taken in the morning. -Consider imaging if symptoms persist after 10 days.  General Health Maintenance -Continue use of humidifier and hot showers for symptomatic relief. -Consider use of a sinus rinse.        No follow-ups on file.   Maretta Bees, PA

## 2023-04-05 ENCOUNTER — Telehealth: Payer: Self-pay

## 2023-04-05 ENCOUNTER — Other Ambulatory Visit (HOSPITAL_COMMUNITY): Payer: Self-pay

## 2023-04-05 NOTE — Telephone Encounter (Signed)
 Pharmacy Patient Advocate Encounter   Received notification from Pt Calls Messages that prior authorization for Zepbound 2.5 is required/requested.   Insurance verification completed.   The patient is insured through [facility] ADV[employer] .   Per test claim: PA required; PA submitted to above mentioned insurance via CoverMyMeds Key/confirmation #/EOC [id number] Status is pending

## 2023-04-05 NOTE — Telephone Encounter (Signed)
 Copied from CRM [id number]. Topic: Clinical - Prescription Issue >> Apr 05, 2023 10:32 AM Theodis Sato wrote: Reason for CRM: Patient is requesting Dr. Milinda Cave to submit another prior authorization request to her insurance for the tirzepatide [facility]) 2.5 MG/0.5ML injection vial - Patients insurance states it will help if the provider add that this medication is for the prevention of type 2 diabetes.

## 2023-04-06 NOTE — Telephone Encounter (Signed)
 noted

## 2023-04-13 ENCOUNTER — Telehealth: Payer: Self-pay

## 2023-04-13 MED ORDER — TIRZEPATIDE-WEIGHT MANAGEMENT 2.5 MG/0.5ML ~~LOC~~ SOLN: 2.5000 mg | SUBCUTANEOUS | 0 refills | Status: AC

## 2023-04-13 NOTE — Telephone Encounter (Signed)
 Zepbound rx sent Needs f/u 3mo

## 2023-04-13 NOTE — Addendum Note (Signed)
 Addended by: Jeoffrey Massed on: 04/13/2023 04:04 PM   Modules accepted: Orders

## 2023-04-13 NOTE — Telephone Encounter (Signed)
 Copied from CRM [id number]. Topic: Clinical - Medication Question >> Apr 13, 2023 12:37 PM Alcus Dad wrote: Reason for EAV:[id number] stated that she can get Zepbound for cheaper if Dr. Milinda Cave calls it in to pharmacy. Please contact patient   Last refill 02/15/23 (50ml,0)

## 2023-04-16 ENCOUNTER — Telehealth: Payer: Self-pay | Admitting: Family Medicine

## 2023-04-16 NOTE — Telephone Encounter (Unsigned)
 Copied from CRM [id number]. Topic: Clinical - Medication Question >> Apr 16, 2023  2:53 PM Shereese L wrote: Reason for CRM: Diagnosis code tirzepatide (ZEPBOUND) 2.5 MG/0.5ML injection vial

## 2023-04-17 ENCOUNTER — Other Ambulatory Visit (HOSPITAL_COMMUNITY): Payer: Self-pay

## 2023-04-17 NOTE — Telephone Encounter (Signed)
 Based on test claim

## 2023-04-17 NOTE — Telephone Encounter (Signed)
error 

## 2023-05-23 ENCOUNTER — Ambulatory Visit: Admitting: Family Medicine

## 2023-08-09 ENCOUNTER — Encounter: Payer: 59 | Admitting: Family Medicine
